# Patient Record
Sex: Female | Born: 1940 | Race: White | Hispanic: No | Smoking: Former smoker
Health system: Southern US, Community
[De-identification: ages and names within clinical notes are randomized; demographics above are authoritative.]

## PROBLEM LIST (undated history)

## (undated) DIAGNOSIS — M1990 Unspecified osteoarthritis, unspecified site: Secondary | ICD-10-CM

## (undated) DIAGNOSIS — I10 Essential (primary) hypertension: Secondary | ICD-10-CM

## (undated) DIAGNOSIS — K219 Gastro-esophageal reflux disease without esophagitis: Secondary | ICD-10-CM

## (undated) DIAGNOSIS — F419 Anxiety disorder, unspecified: Secondary | ICD-10-CM

## (undated) DIAGNOSIS — J302 Other seasonal allergic rhinitis: Secondary | ICD-10-CM

## (undated) HISTORY — PX: PARTIAL HYSTERECTOMY: SHX80

---

## 2010-08-21 ENCOUNTER — Other Ambulatory Visit: Payer: Self-pay

## 2011-06-13 ENCOUNTER — Ambulatory Visit: Payer: Self-pay

## 2011-06-13 LAB — LACTATE DEHYDROGENASE: LDH: 212 U/L (ref 84–246)

## 2011-12-20 ENCOUNTER — Ambulatory Visit: Payer: Self-pay

## 2011-12-20 LAB — LACTATE DEHYDROGENASE: LDH: 177 U/L (ref 81–234)

## 2015-05-11 ENCOUNTER — Ambulatory Visit: Payer: Self-pay | Admitting: Family Medicine

## 2015-06-01 ENCOUNTER — Ambulatory Visit: Payer: Self-pay | Admitting: Family Medicine

## 2015-06-09 ENCOUNTER — Ambulatory Visit: Payer: Self-pay | Admitting: Family Medicine

## 2015-11-05 ENCOUNTER — Ambulatory Visit
Admission: EM | Admit: 2015-11-05 | Discharge: 2015-11-05 | Disposition: A | Discharge status: Home / Self Care | Payer: Medicare Other | Attending: Family Medicine | Admitting: Family Medicine

## 2015-11-05 DIAGNOSIS — L03119 Cellulitis of unspecified part of limb: Secondary | ICD-10-CM

## 2015-11-05 DIAGNOSIS — L02419 Cutaneous abscess of limb, unspecified: Secondary | ICD-10-CM

## 2015-11-05 HISTORY — DX: Other seasonal allergic rhinitis: J30.2

## 2015-11-05 HISTORY — DX: Essential (primary) hypertension: I10

## 2015-11-05 HISTORY — DX: Unspecified osteoarthritis, unspecified site: M19.90

## 2015-11-05 HISTORY — DX: Anxiety disorder, unspecified: F41.9

## 2015-11-05 HISTORY — DX: Gastro-esophageal reflux disease without esophagitis: K21.9

## 2015-11-05 MED ORDER — DOXYCYCLINE HYCLATE 100 MG PO CAPS: 100.0000 mg | ORAL_CAPSULE | Freq: Two times a day (BID) | ORAL | Status: DC

## 2015-11-05 MED ORDER — CEFTRIAXONE SODIUM 1 G IJ SOLR
1.0000 g | Freq: Once | INTRAMUSCULAR | Status: AC
  Administered 2015-11-05: 1 g via INTRAMUSCULAR

## 2015-11-05 MED ORDER — MUPIROCIN 2 % EX OINT: 1.0000 "application " | TOPICAL_OINTMENT | Freq: Three times a day (TID) | CUTANEOUS | Status: DC

## 2015-11-05 MED ORDER — ONDANSETRON 8 MG PO TBDP: 8.0000 mg | ORAL_TABLET | Freq: Three times a day (TID) | ORAL | Status: DC | PRN

## 2015-11-05 NOTE — ED Provider Notes (Signed)
CSN: [id number]     Arrival date & time 11/05/15  1122 History   First MD Initiated Contact with Patient 11/05/15 1302    Nurses notes were reviewed. Chief Complaint  Patient presents with  . Skin Discoloration    Pt with right lower leg redness, warmth and pain starting with itching at first on Thursday. Scratched it and then by Saturday it had turned red and painful. Pain 7/10   Patient is here because of skin discoloration of the right lower leg. She reports scratching her leg Thursday and by Saturday it was red and painful. She is convinced is a possible blood clot explained to her that blood clots usage not occur from scratching her leg a few days earlier and then having this much redness and swelling. Initiation at 1 m touch her leg explained to her that A touch her leg to evaluate her. Turns out that she has a skin condition on both legs and some on her arm that she sees his dermatologist in [place] but she does not know the name of the condition.  This is some type of chronic condition that causes scaling and peeling of the skin past normal for her. For whatever reason it was more itching of the leg on Thursday and that's when she starts scratching. 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  Allergic to multiple antibiotics. There is a long list of antibiotics that she is allergic to including vancomycin Septra and minocycline causes GI upset she's looked to penicillin Levaquin and Avelox and clindamycin.  Past history is positive for arthritis hypertension anxiety GERD. She former smoker and she's had a partial hysterectomy family medical history is essentially noncontributory to this visit.  (Consider location/radiation/quality/duration/timing/severity/associated sxs/prior Treatment) Patient is a 75 y.o. female presenting with rash. The history is provided by the patient  and a relative. No language interpreter was used.  Rash Location:  Foot and leg Leg rash location:  R lower leg Quality: itchiness, redness and swelling   Onset quality:  Sudden Progression:  Worsening Chronicity:  New Relieved by:  None tried Ineffective treatments:  Antibiotic cream Associated symptoms: induration     Past Medical History  Diagnosis Date  . Arthritis   . Hypertension   . Anxiety   . Seasonal allergic rhinitis   . GERD (gastroesophageal reflux disease)    Past Surgical History  Procedure Laterality Date  . Partial hysterectomy     History reviewed. No pertinent family history. Social History  Substance Use Topics  . Smoking status: Former Games developer  . Smokeless tobacco: None  . Alcohol Use: Yes     Comment: occaional   OB History    No data available     Review of Systems  Skin: Positive for rash.  All other systems reviewed and are negative.   Allergies  Avelox; Clindamycin/lincomycin; Levaquin; Minocycline; Penicillins; Sulfa antibiotics; and Vancomycin  Home Medications   Prior to Admission medications   Medication Sig Start Date End Date Taking? Authorizing Provider  amLODipine (NORVASC) 10 MG tablet Take 10 mg by mouth daily.   Yes Historical Provider, MD  esomeprazole (NEXIUM) 20 MG capsule Take 20 mg by mouth daily at 12 noon.   Yes Historical Provider, MD  fentaNYL (DURAGESIC - DOSED MCG/HR) 100 MCG/HR Place 150 mcg onto the skin every 3 (three) days.   Yes  Historical Provider, MD  fexofenadine-pseudoephedrine (ALLEGRA-D 24) 180-240 MG 24 hr tablet Take 1 tablet by mouth daily.   Yes Historical Provider, MD  fluticasone (FLONASE) 50 MCG/ACT nasal spray Place into both nostrils daily.   Yes Historical Provider, MD  hydrochlorothiazide (HYDRODIURIL) 25 MG tablet Take 25 mg by mouth daily.   Yes Historical Provider, MD  lidocaine (LIDODERM) 5 % Place 1 patch onto the skin daily. Remove & Discard patch within 12 hours or as directed by MD    Yes Historical Provider, MD  losartan (COZAAR) 50 MG tablet Take 50 mg by mouth daily.   Yes Historical Provider, MD  oxyCODONE-acetaminophen (PERCOCET) 10-325 MG tablet Take 1 tablet by mouth every 4 (four) hours as needed for pain.   Yes Historical Provider, MD  psyllium (METAMUCIL) 58.6 % packet Take 1 packet by mouth daily.   Yes Historical Provider, MD  sertraline (ZOLOFT) 50 MG tablet Take 50 mg by mouth daily.   Yes Historical Provider, MD  doxycycline (VIBRAMYCIN) 100 MG capsule Take 1 capsule (100 mg total) by mouth 2 (two) times daily. 11/05/15   Hassan Rowan, MD  mupirocin ointment (BACTROBAN) 2 % Apply 1 application topically 3 (three) times daily. 11/05/15   Hassan Rowan, MD  ondansetron (ZOFRAN ODT) 8 MG disintegrating tablet Take 1 tablet (8 mg total) by mouth every 8 (eight) hours as needed for nausea or vomiting. 11/05/15   Hassan Rowan, MD   Meds Ordered and Administered this Visit   Medications  cefTRIAXone (ROCEPHIN) injection 1 g (1 g Intramuscular Given 11/05/15 1334)         BP 140/88 mmHg  Pulse 79  Temp(Src) 98 F (36.7 C) (Oral)  Resp 20  Ht 4\' 10"  (1.473 m)  Wt 130 lb (58.968 kg)  BMI 27.18 kg/m2  SpO2 100% No data found.   Physical Exam  Constitutional: She is oriented to person, place, and time. She appears well-developed.  HENT:  Head: Normocephalic and atraumatic.  Eyes: Pupils are equal, round, and reactive to light.  Neurological: She is alert and oriented to person, place, and time.  Skin: Lesion and rash noted. There is erythema.     On the back of the right leg including the calf is marked redness erythema and warmth and apparently very tender to palpation. Homans sign is positive but is also a eschar on the back of the right calf and on the dorsum of the right foot the eschar on the back of the right calf as well as she's been scratching. Is also scar tissue on the lateral aspect of the right lower leg which she had a skin cancer removed. On all 4 of  his extremities she's got a healing type of dermatitis. The legs is extremely much worse in the upper arms and looks like almost some type of plaque like disease forming on the surface of both lower extremities.  Psychiatric: Her behavior is normal.  Vitals reviewed.   ED Course  Procedures (including critical care time)  Labs Review Labs Reviewed - No data to display  Imaging Review No results found.   Visual Acuity Review  Right Eye Distance:   Left Eye Distance:   Bilateral Distance:    Right Eye Near:   Left Eye Near:    Bilateral Near:         MDM   1. Cellulitis and abscess of leg      Explained to her that and a relative that she has cellulitis of  the right lower leg. Apparently scratching Thursday evening she felt that that was in the oculus broke the skin enough to allow staff prescription or strep infection to occur. She also has a dermatological lesion of both lower extremities and upper extent his most worse in the lower extremities that she does not know the name of. We also seems to be a good source for skin breakdown and skin barrier to be poor. Will Mr. shot of Rocephin 1 g here. Explained to her that I want you with the staff agent like Septra but I cannot because she has had anaphylactic response before. Since the medicine of systemic we'll try doxycycline with food and Zofran for nausea. Will also place her on Bactroban ointment. Strongly suggest she follow-up with her PCP on Tuesday if not better for reevaluation and re-reassessment. Also explain to them that she may wind up in the emergency room may require IV antibiotics even though she can't take IV vancomycin  Should be noted that he wanted patient is sitting here she is actively rubbing the right leg     Hassan Rowan, MD 11/05/15 1342

## 2015-11-05 NOTE — Discharge Instructions (Signed)
Cellulitis Cellulitis is an infection of the skin and the tissue under the skin. The infected area is usually red and tender. This happens most often in the arms and lower legs. HOME CARE   Take your antibiotic medicine as told. Finish the medicine even if you start to feel better.  Keep the infected arm or leg raised (elevated).  Put a warm cloth on the area up to 4 times per day.  Only take medicines as told by your doctor.  Keep all doctor visits as told. GET HELP IF:  You see red streaks on the skin coming from the infected area.  Your red area gets bigger or turns a dark color.  Your bone or joint under the infected area is painful after the skin heals.  Your infection comes back in the same area or different area.  You have a puffy (swollen) bump in the infected area.  You have new symptoms.  You have a fever. GET HELP RIGHT AWAY IF:   You feel very sleepy.  You throw up (vomit) or have watery poop (diarrhea).  You feel sick and have muscle aches and pains.   This information is not intended to replace advice given to you by your health care provider. Make sure you discuss any questions you have with your health care provider.   Document Released: 11/06/2007 Document Revised: 02/08/2015 Document Reviewed: 08/05/2011 Elsevier Interactive Patient Education 2016 [employer].  MRSA FAQs What is MRSA? Staphylococcus aureus (pronounced staff-ill-oh-KOK-us AW-ree-us), or "Staph" is a very common germ that about 1 out of every 3 people have on their skin or in their nose. This germ does not cause any problems for most people who have it on their skin. But sometimes it can cause serious infections such as skin or wound infections, pneumonia, or infections of the blood.  Antibiotics are given to kill Staph germs when they cause infections. Some Staph are resistant, meaning they cannot be killed by some antibiotics. "Methicillin-resistant Staphylococcus aureus" or "MRSA" is a  type of Staph that is resistant to some of the antibiotics that are often used to treat Staph infections. Who is most likely to get an MRSA infection? In the hospital, people who are more likely to get an MRSA infection are people who:  have other health conditions making them sick  have been in the hospital or a nursing home  have been treated with antibiotics. People who are healthy and who have not been in the hospital or a nursing home can also get MRSA infections. These infections usually involve the skin. More information about this type of MRSA infection, known as "community-associated MRSA" infection, is available from the Centers for Disease Control and Prevention (CDC). ReportNation.uy How do I get an MRSA infection? People who have MRSA germs on their skin or who are infected with MRSA may be able to spread the germ to other people. MRSA can be passed on to bed linens, bed rails, bathroom fixtures, and medical equipment. It can spread to other people on contaminated equipment and on the hands of doctors, nurses, other healthcare providers and visitors. Can MRSA infections be treated? Yes, there are antibiotics that can kill MRSA germs. Some patients with MRSA abscesses may need surgery to drain the infection. Your healthcare provider will determine which treatments are best for you. What are some of the things that hospitals are doing to prevent MRSA infections? To prevent MRSA infections, doctors, nurses and other healthcare providers:  Clean their hands with  soap and water or an alcohol-based hand rub before and after caring for every patient.  Carefully clean hospital rooms and medical equipment.  Use Contact Precautions when caring for patients with MRSA. Contact Precautions mean:  Whenever possible, patients with MRSA will have a single room or will share a room only with someone else who also has MRSA.  Healthcare providers will put on gloves and wear a gown over  their clothing while taking care of patients with MRSA.  Visitors may also be asked to wear a gown and gloves.  When leaving the room, hospital providers and visitors remove their gown and gloves and clean their hands.  Patients on Contact Precautions are asked to stay in their hospital rooms as much as possible. They should not go to common areas, such as the gift shop or cafeteria. They may go to other areas of the hospital for treatments and tests.  May test some patients to see if they have MRSA on their skin. This test involves rubbing a cotton-tipped swab in the patient's nostrils or on the skin. What can I do to help prevent MRSA infections? In the hospital  Make sure that all doctors, nurses, and other healthcare providers clean their hands with soap and water or an alcohol-based hand rub before and after caring for you. If you do not see your providers clean their hands, please ask them to do so. When you go home  If you have wounds or an intravascular device (such as a catheter or dialysis port) make sure that you know how to take care of them. Can my friends and family get MRSA when they visit me? The chance of getting MRSA while visiting a person who has MRSA is very low. To decrease the chance of getting MRSA your family and friends should:  Clean their hands before they enter your room and when they leave.  Ask a healthcare provider if they need to wear protective gowns and gloves when they visit you. What do I need to do when I go home from the hospital? To prevent another MRSA infection and to prevent spreading MRSA to others:  Keep taking any antibiotics prescribed by your doctor. Don't take half-doses or stop before you complete your prescribed course.  Clean your hands often, especially before and after changing your wound dressing or bandage.  People who live with you should clean their hands often as well.  Keep any wounds clean and change bandages as instructed  until healed.  Avoid sharing personal items such as towels or razors.  Wash and dry your clothes and bed linens in the warmest temperatures recommended on the labels.  Tell your healthcare providers that you have MRSA. This includes home health nurses and aides, therapists, and personnel in doctors' offices.  Your doctor may have more instructions for you. If you have questions, please ask your doctor or nurse. Developed and co-sponsored by [employer] for [employer] of [place] [phone]); Infectious Diseases Society of America (IDSA); [facility]; Association for Professionals in Infection Control and Epidemiology (APIC); Centers for Disease Control and Prevention (CDC); and [employer]   This information is not intended to replace advice given to you by your health care provider. Make sure you discuss any questions you have with your health care provider.   Document Released: 05/25/2013 Document Reviewed: 08/03/2014 Elsevier Interactive Patient Education [employer]

## 2015-12-25 ENCOUNTER — Encounter
Admission: RE | Admit: 2015-12-25 | Discharge: 2015-12-25 | Disposition: A | Discharge status: Home / Self Care | Payer: Medicare Other | Source: Ambulatory Visit | Attending: Internal Medicine | Admitting: Internal Medicine

## 2015-12-27 LAB — COMPREHENSIVE METABOLIC PANEL
ALK PHOS: 58 U/L (ref 38–126)
ALT: 38 U/L (ref 14–54)
AST: 50 U/L — ABNORMAL HIGH (ref 15–41)
Albumin: 3.1 g/dL — ABNORMAL LOW (ref 3.5–5.0)
Anion gap: 5 (ref 5–15)
BILIRUBIN TOTAL: 0.9 mg/dL (ref 0.3–1.2)
BUN: 24 mg/dL — AB (ref 6–20)
CHLORIDE: 103 mmol/L (ref 101–111)
CO2: 30 mmol/L (ref 22–32)
CREATININE: 1.05 mg/dL — AB (ref 0.44–1.00)
Calcium: 8.7 mg/dL — ABNORMAL LOW (ref 8.9–10.3)
GFR calc Af Amer: 60 mL/min — ABNORMAL LOW (ref >60)
GFR, EST NON AFRICAN AMERICAN: 51 mL/min — AB (ref >60)
GLUCOSE: 112 mg/dL — AB (ref 65–99)
Potassium: 4 mmol/L (ref 3.5–5.1)
Sodium: 138 mmol/L (ref 135–145)
Total Protein: 6.8 g/dL (ref 6.5–8.1)

## 2015-12-27 LAB — CBC WITH DIFFERENTIAL/PLATELET
Basophils Absolute: 0.1 10*3/uL (ref 0–0.1)
Basophils Relative: 1 %
EOS PCT: 5 %
Eosinophils Absolute: 0.5 10*3/uL (ref 0–0.7)
HEMATOCRIT: 27 % — AB (ref 35.0–47.0)
Hemoglobin: 9.4 g/dL — ABNORMAL LOW (ref 12.0–16.0)
LYMPHS ABS: 1.7 10*3/uL (ref 1.0–3.6)
LYMPHS PCT: 20 %
MCH: 32.2 pg (ref 26.0–34.0)
MCHC: 34.8 g/dL (ref 32.0–36.0)
MCV: 92.3 fL (ref 80.0–100.0)
Monocytes Absolute: 0.6 10*3/uL (ref 0.2–0.9)
Monocytes Relative: 7 %
Neutro Abs: 5.7 10*3/uL (ref 1.4–6.5)
Neutrophils Relative %: 67 %
PLATELETS: 457 10*3/uL — AB (ref 150–440)
RBC: 2.93 MIL/uL — AB (ref 3.80–5.20)
RDW: 15.9 % — ABNORMAL HIGH (ref 11.5–14.5)
WBC: 8.5 10*3/uL (ref 3.6–11.0)

## 2016-01-02 ENCOUNTER — Encounter
Admission: RE | Admit: 2016-01-02 | Discharge: 2016-01-02 | Disposition: A | Discharge status: Home / Self Care | Payer: Medicare Other | Source: Ambulatory Visit | Attending: Internal Medicine | Admitting: Internal Medicine

## 2016-01-18 ENCOUNTER — Non-Acute Institutional Stay (SKILLED_NURSING_FACILITY): Payer: Medicare Other | Admitting: Gerontology

## 2016-01-18 DIAGNOSIS — S7291XD Unspecified fracture of right femur, subsequent encounter for closed fracture with routine healing: Secondary | ICD-10-CM

## 2016-01-18 DIAGNOSIS — G8929 Other chronic pain: Secondary | ICD-10-CM | POA: Diagnosis not present

## 2016-01-20 NOTE — Progress Notes (Signed)
Location:      Place of Service:  SNF (31) Provider:  Toni Arthurs, NP-C  No primary care provider on file.  No care team member to display  Extended Emergency Contact Information Primary Emergency Contact: Carter,Todd  [place] Mobile Phone: [phone] Relation: Son  Code Status:  full Goals of care: Advanced Directive information Advanced Directives 11/05/2015  Does patient have an advance directive? Yes  Type of Advance Directive Living will;Healthcare Power of Attorney     Chief Complaint  Patient presents with  . Follow-up    HPI:  Pt is a 75 y.o. female seen today for medical management of chronic diseases. She does have chronic pain r/t rheumatoid arthritis and crohn's disease. She is already on Fentanyl 150 mcg patch- changed EOD for pain control which complicated pain control for the femur fracture. She is participating in physical therapy and occupational therapy and is progressing well. VSS. No other complaints.      Past Medical History:  Diagnosis Date  . Anxiety   . Arthritis   . GERD (gastroesophageal reflux disease)   . Hypertension   . Seasonal allergic rhinitis    Past Surgical History:  Procedure Laterality Date  . PARTIAL HYSTERECTOMY      Allergies  Allergen Reactions  . Avelox [Moxifloxacin Hcl In Nacl] Swelling  . Calamine     RASH  . Clindamycin Diarrhea and Nausea Only  . Clindamycin/Lincomycin Nausea And Vomiting  . Doxepin     Other reaction(s): Diarrhea  . Fluorouracil     Other reaction(s): Rash  . Levaquin [Levofloxacin] Diarrhea  . Minocycline Diarrhea and Nausea Only  . Penicillins Swelling  . Sulfa Antibiotics Hives  . Vancomycin Swelling  . Latex Rash  . Neomycin-Bacitracin Zn-Polymyx Rash      Medication List       Accurate as of 01/18/16 11:59 PM. Always use your most recent med list.          amLODipine 10 MG tablet Commonly known as:  NORVASC Take 10 mg by mouth daily.   doxycycline  100 MG capsule Commonly known as:  VIBRAMYCIN Take 1 capsule (100 mg total) by mouth 2 (two) times daily.   esomeprazole 20 MG capsule Commonly known as:  NEXIUM Take 20 mg by mouth daily at 12 noon.   Esomeprazole Magnesium 20 MG Tbec Take by mouth.   fentaNYL 100 MCG/HR Commonly known as:  DURAGESIC - dosed mcg/hr Place 150 mcg onto the skin every 3 (three) days.   fentaNYL 50 MCG/HR Commonly known as:  DURAGESIC - dosed mcg/hr Place onto the skin.   fexofenadine-pseudoephedrine 180-240 MG 24 hr tablet Commonly known as:  ALLEGRA-D 24 Take 1 tablet by mouth daily.   fluticasone 50 MCG/ACT nasal spray Commonly known as:  FLONASE Place into both nostrils daily.   hydrochlorothiazide 25 MG tablet Commonly known as:  HYDRODIURIL Take 25 mg by mouth daily.   IRON PO Take by mouth.   lidocaine 5 % Commonly known as:  LIDODERM Place 1 patch onto the skin daily. Remove & Discard patch within 12 hours or as directed by MD   losartan 50 MG tablet Commonly known as:  COZAAR Take 50 mg by mouth daily.   mupirocin ointment 2 % Commonly known as:  BACTROBAN Apply 1 application topically 3 (three) times daily.   ondansetron 8 MG disintegrating tablet Commonly known as:  ZOFRAN ODT Take 1 tablet (8 mg total) by mouth every 8 (eight) hours as  needed for nausea or vomiting.   oxyCODONE-acetaminophen 10-325 MG tablet Commonly known as:  PERCOCET Take 1 tablet by mouth every 4 (four) hours as needed for pain.   psyllium 58.6 % packet Commonly known as:  METAMUCIL Take 1 packet by mouth daily.   sertraline 50 MG tablet Commonly known as:  ZOLOFT Take 50 mg by mouth daily.       Review of Systems  Constitutional: Negative for activity change, appetite change, chills, diaphoresis and fever.  HENT: Negative for congestion, sneezing, sore throat, trouble swallowing and voice change.   Respiratory: Negative for apnea, cough, choking, chest tightness, shortness of breath  and wheezing.   Cardiovascular: Negative for chest pain, palpitations and leg swelling.  Gastrointestinal: Negative for abdominal distention, abdominal pain, constipation, diarrhea and nausea.  Genitourinary: Negative for difficulty urinating, dysuria, frequency and urgency.  Musculoskeletal: Positive for arthralgias (typical arthritis) and gait problem. Negative for back pain and myalgias.  Skin: Positive for wound (right hip incisions). Negative for color change, pallor and rash.  Neurological: Negative for dizziness, tremors, syncope, speech difficulty, weakness, numbness and headaches.  Psychiatric/Behavioral: Negative for agitation and behavioral problems.  All other systems reviewed and are negative.    There is no immunization history on file for this patient. There are no preventive care reminders to display for this patient. No flowsheet data found. Functional Status Survey:    Vitals:   01/18/16 0500  BP: (!) 142/53  Pulse: 67  Resp: 16  Temp: 98.1 F (36.7 C)  SpO2: 97%  Weight: 128 lb 1.6 oz (58.1 kg)   Body mass index is 26.77 kg/m. Physical Exam  Constitutional: She is oriented to person, place, and time. Vital signs are normal. She appears well-developed and well-nourished. She is active and cooperative. She does not appear ill. No distress.  HENT:  Head: Normocephalic and atraumatic.  Mouth/Throat: Uvula is midline, oropharynx is clear and moist and mucous membranes are normal. Mucous membranes are not pale, not dry and not cyanotic.  Eyes: Conjunctivae, EOM and lids are normal. Pupils are equal, round, and reactive to light.  Neck: Trachea normal, normal range of motion and full passive range of motion without pain. Neck supple. No JVD present. No tracheal deviation, no edema and no erythema present. No thyromegaly present.  Cardiovascular: Normal rate, regular rhythm, normal heart sounds, intact distal pulses and normal pulses.  Exam reveals no gallop and no  distant heart sounds.   Pulmonary/Chest: Effort normal and breath sounds normal. No accessory muscle usage. No respiratory distress. She has no wheezes. She has no rales. She exhibits no tenderness.  Abdominal: Soft. Normal appearance and bowel sounds are normal. She exhibits no distension and no ascites. There is no tenderness.  Musculoskeletal: She exhibits no edema.       Right hip: She exhibits decreased range of motion, decreased strength and tenderness.  Expected osteoarthritis, stiffness; chronic pain; calves, soft, supple. Negative homan's sign  Neurological: She is alert and oriented to person, place, and time. She has normal strength.  Skin: Skin is warm and dry. Laceration (Right hip incision) noted. No rash noted. She is not diaphoretic. No cyanosis or erythema. No pallor. Nails show no clubbing.  Psychiatric: She has a normal mood and affect. Her speech is normal and behavior is normal. Judgment and thought content normal. Cognition and memory are normal.  Nursing note and vitals reviewed.   Labs reviewed:  Recent Labs  12/27/15 1020  NA 138  K 4.0  CL 103  CO2 30  GLUCOSE 112*  BUN 24*  CREATININE 1.05*  CALCIUM 8.7*    Recent Labs  12/27/15 1020  AST 50*  ALT 38  ALKPHOS 58  BILITOT 0.9  PROT 6.8  ALBUMIN 3.1*    Recent Labs  12/27/15 1020  WBC 8.5  NEUTROABS 5.7  HGB 9.4*  HCT 27.0*  MCV 92.3  PLT 457*   No results found for: TSH No results found for: HGBA1C No results found for: CHOL, HDL, LDLCALC, LDLDIRECT, TRIG, CHOLHDL  Significant Diagnostic Results in last 30 days:  No results found.  Assessment/Plan 1. Closed fracture of right femur with routine healing, unspecified fracture morphology, unspecified portion of femur, subsequent encounter Pain control as listed below Continue current pt/ot schedule Continue scheduled APAP 650 mg po QID for pain Ice pack to right hip prn Continue lovenox for dvt prophylaxis   2. Chronic  pain Increase fentanyl patch dosing to a 100 mcg patch, 75 mcg patch and a 12 mcg patch Re-evaluate amount of prn oxycodone taken over the weekend and possibly increase patch dosing to 200 mcg on Monday or Tuesday    Family/ staff Communication:   Total Time: 30 minutes  Documentation: 15 minutes  Face to Face: 15 minutes  Family/Phone:   Labs/tests ordered: cbc. Met c  Medication list reviewed and assessed for continued appropriateness. Monthly medication orders reviewed and signed.  Vikki Ports, NP-C Geriatrics [facility] [phone], [place] [address] Cell Phone (Mon-Fri 8am-5pm):  [phone] On Call:  [phone] & follow prompts after 5pm & weekends Office Phone:  [phone] Office Fax:  [phone]

## 2016-10-07 ENCOUNTER — Ambulatory Visit: Payer: Medicare Other | Admitting: Pain Medicine

## 2017-06-05 ENCOUNTER — Emergency Department
Admission: EM | Admit: 2017-06-05 | Discharge: 2017-06-06 | Disposition: A | Discharge status: Home / Self Care | Payer: Medicare Other | Attending: Emergency Medicine | Admitting: Emergency Medicine

## 2017-06-05 ENCOUNTER — Emergency Department: Payer: Medicare Other

## 2017-06-05 DIAGNOSIS — I10 Essential (primary) hypertension: Secondary | ICD-10-CM | POA: Insufficient documentation

## 2017-06-05 DIAGNOSIS — S59911A Unspecified injury of right forearm, initial encounter: Secondary | ICD-10-CM | POA: Diagnosis present

## 2017-06-05 DIAGNOSIS — Y9301 Activity, walking, marching and hiking: Secondary | ICD-10-CM | POA: Insufficient documentation

## 2017-06-05 DIAGNOSIS — Y999 Unspecified external cause status: Secondary | ICD-10-CM | POA: Diagnosis not present

## 2017-06-05 DIAGNOSIS — Y92129 Unspecified place in nursing home as the place of occurrence of the external cause: Secondary | ICD-10-CM | POA: Diagnosis not present

## 2017-06-05 DIAGNOSIS — W010XXA Fall on same level from slipping, tripping and stumbling without subsequent striking against object, initial encounter: Secondary | ICD-10-CM | POA: Diagnosis not present

## 2017-06-05 DIAGNOSIS — S52134A Nondisplaced fracture of neck of right radius, initial encounter for closed fracture: Secondary | ICD-10-CM | POA: Insufficient documentation

## 2017-06-05 DIAGNOSIS — Z91040 Latex allergy status: Secondary | ICD-10-CM | POA: Insufficient documentation

## 2017-06-05 DIAGNOSIS — Z79899 Other long term (current) drug therapy: Secondary | ICD-10-CM | POA: Diagnosis not present

## 2017-06-05 DIAGNOSIS — Z87891 Personal history of nicotine dependence: Secondary | ICD-10-CM | POA: Diagnosis not present

## 2017-06-05 DIAGNOSIS — W19XXXA Unspecified fall, initial encounter: Secondary | ICD-10-CM

## 2017-06-05 MED ORDER — ACETAMINOPHEN 500 MG PO TABS
1000.0000 mg | ORAL_TABLET | Freq: Once | ORAL | Status: AC
  Administered 2017-06-05: 1000 mg via ORAL
  Filled 2017-06-05: qty 2

## 2017-06-05 NOTE — ED Provider Notes (Signed)
[facility] Emergency Department Provider Note   ____________________________________________   I have reviewed the triage vital signs and the nursing notes.   HISTORY  Chief Complaint Fall   History limited by: Not Limited   HPI Andrea Barker is a 77 y.o. female who presents to the emergency department today because of right elbow pain.   LOCATION:right elbow DURATION:roughly 9 hours TIMING: started a little after a fall, gradually worsening SEVERITY: severe CONTEXT: patient states that she fell earlier today when she tripped over her walker. She denies any feeling of lightheadedness. Fell onto her right hand. Did not have any immediate pain however the pain developed a little while later. Since then it has been constant and worsening. She has not tried any pain medication. MODIFYING FACTORS: worse with movement ASSOCIATED SYMPTOMS: denies any other acute injury  Per medical record review patient has a history of arthritis  Past Medical History:  Diagnosis Date  . Anxiety   . Arthritis   . GERD (gastroesophageal reflux disease)   . Hypertension   . Seasonal allergic rhinitis     There are no active problems to display for this patient.   Past Surgical History:  Procedure Laterality Date  . PARTIAL HYSTERECTOMY      Prior to Admission medications   Medication Sig Start Date End Date Taking? Authorizing Provider  amLODipine (NORVASC) 10 MG tablet Take 10 mg by mouth daily.    [provider]  doxycycline (VIBRAMYCIN) 100 MG capsule Take 1 capsule (100 mg total) by mouth 2 (two) times daily. 11/05/15   Hassan Rowan, MD  esomeprazole (NEXIUM) 20 MG capsule Take 20 mg by mouth daily at 12 noon.    [provider]  Esomeprazole Magnesium 20 MG TBEC Take by mouth.    [provider]  fentaNYL (DURAGESIC - DOSED MCG/HR) 100 MCG/HR Place 150 mcg onto the skin every 3 (three) days.    [provider]  fentaNYL  (DURAGESIC - DOSED MCG/HR) 50 MCG/HR Place onto the skin.    [provider]  fexofenadine-pseudoephedrine (ALLEGRA-D 24) 180-240 MG 24 hr tablet Take 1 tablet by mouth daily.    [provider]  fluticasone (FLONASE) 50 MCG/ACT nasal spray Place into both nostrils daily.    [provider]  hydrochlorothiazide (HYDRODIURIL) 25 MG tablet Take 25 mg by mouth daily.    [provider]  IRON PO Take by mouth.    [provider]  lidocaine (LIDODERM) 5 % Place 1 patch onto the skin daily. Remove & Discard patch within 12 hours or as directed by MD    [provider]  losartan (COZAAR) 50 MG tablet Take 50 mg by mouth daily.    [provider]  mupirocin ointment (BACTROBAN) 2 % Apply 1 application topically 3 (three) times daily. 11/05/15   Hassan Rowan, MD  ondansetron (ZOFRAN ODT) 8 MG disintegrating tablet Take 1 tablet (8 mg total) by mouth every 8 (eight) hours as needed for nausea or vomiting. 11/05/15   Hassan Rowan, MD  oxyCODONE-acetaminophen (PERCOCET) 10-325 MG tablet Take 1 tablet by mouth every 4 (four) hours as needed for pain.    [provider]  psyllium (METAMUCIL) 58.6 % packet Take 1 packet by mouth daily.    [provider]  sertraline (ZOLOFT) 50 MG tablet Take 50 mg by mouth daily.    [provider]    Allergies Avelox [moxifloxacin hcl in nacl]; Calamine; Clindamycin; Clindamycin/lincomycin; Doxepin; Fluorouracil; Levaquin [levofloxacin];  Minocycline; Penicillins; Sulfa antibiotics; Vancomycin; Latex; and Neomycin-bacitracin zn-polymyx  No family history on file.  Social History Social History   Tobacco Use  . Smoking status: Former Smoker  Substance Use Topics  . Alcohol use: Yes    Comment: occaional  . Drug use: Not on file    Review of Systems Constitutional: No fever/chills Eyes: No visual changes. ENT: No sore throat. Cardiovascular: Denies chest pain. Respiratory: Denies  shortness of breath. Gastrointestinal: No abdominal pain.  No nausea, no vomiting.  No diarrhea.   Genitourinary: Negative for dysuria. Musculoskeletal: Positive for right elbow pain. Positive for chronic back pain. Skin: Negative for rash. Neurological: Negative for headaches, focal weakness or numbness.  ____________________________________________   PHYSICAL EXAM:  VITAL SIGNS: ED Triage Vitals [06/05/17 2313]  Enc Vitals Group     BP      Pulse      Resp      Temp      Temp src      SpO2 98 %     Weight      Height      Head Circumference      Peak Flow      Pain Score      Pain Loc      Pain Edu?      Excl. in GC?      Constitutional: Alert and oriented. Well appearing and in no distress. Eyes: Conjunctivae are normal.  ENT   Head: Normocephalic and atraumatic.   Nose: No congestion/rhinnorhea.   Mouth/Throat: Mucous membranes are moist.   Neck: No stridor. Hematological/Lymphatic/Immunilogical: No cervical lymphadenopathy. Cardiovascular: Normal rate, regular rhythm.  No murmurs, rubs, or gallops.  Respiratory: Normal respiratory effort without tachypnea nor retractions. Breath sounds are clear and equal bilaterally. No wheezes/rales/rhonchi. Gastrointestinal: Soft and non tender. No rebound. No guarding.  Genitourinary: Deferred Musculoskeletal: Right upper extremity without deformity. Mild tenderness to the elbow with palpation and manipulation. RP 2+. Grip strength 5/5.  Neurologic:  Normal speech and language. No gross focal neurologic deficits are appreciated.  Skin:  Skin is warm, dry and intact. No rash noted. Psychiatric: Mood and affect are normal. Speech and behavior are normal. Patient exhibits appropriate insight and judgment.  ____________________________________________    LABS (pertinent positives/negatives)  None  ____________________________________________   EKG  None  ____________________________________________     RADIOLOGY  Right elbow Effusion with probably impacted fracture of radial neck  ____________________________________________   PROCEDURES  Procedures  ____________________________________________   INITIAL IMPRESSION / ASSESSMENT AND PLAN / ED COURSE  Pertinent labs & imaging results that were available during my care of the patient were reviewed by me and considered in my medical decision making (see chart for details).  Patient presents with right elbow pain after a fall. X-ray is concerning for impacted fracture of radial neck. Will place patient in sling. Discussed importance of early range of motion of joint. Discussed orthopedic follow up.   ____________________________________________   FINAL CLINICAL IMPRESSION(S) / ED DIAGNOSES  Final diagnoses:  Fall, initial encounter  Closed nondisplaced fracture of neck of right radius, initial encounter     Note: This dictation was prepared with Dragon dictation. Any transcriptional errors that result from this process are unintentional     Phineas Semen, MD 06/06/17 0130

## 2017-06-05 NOTE — ED Triage Notes (Signed)
Pt arrived via EMS from [facility] with reports of a fall around 2 pm today. EMS report pt was checked out at facility but has had increasing pain in her right arm since the fall, unable to move rt arm. No distress noted at this time.

## 2017-06-06 NOTE — ED Notes (Signed)
RN called report on pt to [facility]. Report given to Casimiro Needle

## 2017-06-06 NOTE — Discharge Instructions (Signed)
Please seek medical attention for any high fevers, chest pain, shortness of breath, change in behavior, persistent vomiting, bloody stool or any other new or concerning symptoms.  

## 2017-06-06 NOTE — ED Notes (Signed)
E signature pad currently not working. Pt verbalized understanding of discharge, and follow up  Instructions, discharge paperwork place in packet to be sent with pt via ems

## 2017-06-16 ENCOUNTER — Encounter: Payer: Self-pay | Admitting: *Deleted

## 2017-06-16 ENCOUNTER — Other Ambulatory Visit: Payer: Self-pay

## 2017-06-16 DIAGNOSIS — L03116 Cellulitis of left lower limb: Secondary | ICD-10-CM | POA: Diagnosis not present

## 2017-06-16 DIAGNOSIS — Z87891 Personal history of nicotine dependence: Secondary | ICD-10-CM | POA: Insufficient documentation

## 2017-06-16 DIAGNOSIS — S81802D Unspecified open wound, left lower leg, subsequent encounter: Secondary | ICD-10-CM | POA: Diagnosis present

## 2017-06-16 DIAGNOSIS — I10 Essential (primary) hypertension: Secondary | ICD-10-CM | POA: Diagnosis not present

## 2017-06-16 DIAGNOSIS — Z91040 Latex allergy status: Secondary | ICD-10-CM | POA: Diagnosis not present

## 2017-06-16 DIAGNOSIS — X58XXXD Exposure to other specified factors, subsequent encounter: Secondary | ICD-10-CM | POA: Diagnosis not present

## 2017-06-16 LAB — COMPREHENSIVE METABOLIC PANEL
ALK PHOS: 88 U/L (ref 38–126)
ALT: 14 U/L (ref 14–54)
ANION GAP: 10 (ref 5–15)
AST: 23 U/L (ref 15–41)
Albumin: 3.9 g/dL (ref 3.5–5.0)
BUN: 38 mg/dL — ABNORMAL HIGH (ref 6–20)
CALCIUM: 9.1 mg/dL (ref 8.9–10.3)
CO2: 28 mmol/L (ref 22–32)
Chloride: 99 mmol/L — ABNORMAL LOW (ref 101–111)
Creatinine, Ser: 1.59 mg/dL — ABNORMAL HIGH (ref 0.44–1.00)
GFR calc Af Amer: 36 mL/min — ABNORMAL LOW (ref >60)
GFR calc non Af Amer: 31 mL/min — ABNORMAL LOW (ref >60)
GLUCOSE: 132 mg/dL — AB (ref 65–99)
Potassium: 4.2 mmol/L (ref 3.5–5.1)
Sodium: 137 mmol/L (ref 135–145)
TOTAL PROTEIN: 8.2 g/dL — AB (ref 6.5–8.1)
Total Bilirubin: 0.6 mg/dL (ref 0.3–1.2)

## 2017-06-16 LAB — CBC
HEMATOCRIT: 38.5 % (ref 35.0–47.0)
HEMOGLOBIN: 13.2 g/dL (ref 12.0–16.0)
MCH: 31.7 pg (ref 26.0–34.0)
MCHC: 34.2 g/dL (ref 32.0–36.0)
MCV: 92.8 fL (ref 80.0–100.0)
Platelets: 461 10*3/uL — ABNORMAL HIGH (ref 150–440)
RBC: 4.15 MIL/uL (ref 3.80–5.20)
RDW: 13.4 % (ref 11.5–14.5)
WBC: 8.2 10*3/uL (ref 3.6–11.0)

## 2017-06-16 NOTE — ED Notes (Signed)
Pt to triage via w/c with no distress noted, brought in by EMS from [facility] for c/o of "scratch" to leg that is not healing

## 2017-06-16 NOTE — ED Triage Notes (Signed)
Pt to triage via wheelchair.  Pt brought in via ems form [facility].  Pt has pain in right arm from a fall last week.  Pt wearing a sling on right arm.  Pt also here for wound check of left lower leg.  Pt has redness and drainage.  Wound for 3 days.  Pt alert.

## 2017-06-17 ENCOUNTER — Emergency Department
Admission: EM | Admit: 2017-06-17 | Discharge: 2017-06-17 | Disposition: A | Discharge status: Home / Self Care | Payer: Medicare Other | Attending: Emergency Medicine | Admitting: Emergency Medicine

## 2017-06-17 DIAGNOSIS — L03116 Cellulitis of left lower limb: Secondary | ICD-10-CM | POA: Diagnosis not present

## 2017-06-17 MED ORDER — CEPHALEXIN 500 MG PO CAPS: 500.0000 mg | ORAL_CAPSULE | Freq: Two times a day (BID) | ORAL | 0 refills | Status: AC

## 2017-06-17 MED ORDER — CEPHALEXIN 500 MG PO CAPS
500.0000 mg | ORAL_CAPSULE | Freq: Once | ORAL | Status: AC
  Administered 2017-06-17: 500 mg via ORAL
  Filled 2017-06-17: qty 1

## 2017-06-17 NOTE — ED Notes (Signed)
Pt states her son can not transport pt home.

## 2017-06-17 NOTE — ED Provider Notes (Signed)
[facility] Emergency Department Provider Note    First MD Initiated Contact with Patient 06/17/17 [phone]     (approximate)  I have reviewed the triage vital signs and the nursing notes.   HISTORY  Chief Complaint Wound Check and Arm Injury    HPI Andrea Barker is a 77 y.o. female with below list of chronic medical conditions presents to the emergency department via EMS from [facility] with left lower extremity wound which patient states that she sustained 3 days ago secondary to a "scratch".  Patient admits to purulent drainage answer site.  Patient denies any fever   Past Medical History:  Diagnosis Date  . Anxiety   . Arthritis   . GERD (gastroesophageal reflux disease)   . Hypertension   . Seasonal allergic rhinitis     There are no active problems to display for this patient.   Past Surgical History:  Procedure Laterality Date  . PARTIAL HYSTERECTOMY      Prior to Admission medications   Medication Sig Start Date End Date Taking? Authorizing Provider  amLODipine (NORVASC) 10 MG tablet Take 10 mg by mouth daily.    [provider]  cephALEXin (KEFLEX) 500 MG capsule Take 1 capsule (500 mg total) by mouth 2 (two) times daily for 10 days. 06/17/17 06/27/17  Darci Current, MD  doxycycline (VIBRAMYCIN) 100 MG capsule Take 1 capsule (100 mg total) by mouth 2 (two) times daily. 11/05/15   Hassan Rowan, MD  esomeprazole (NEXIUM) 20 MG capsule Take 20 mg by mouth daily at 12 noon.    [provider]  Esomeprazole Magnesium 20 MG TBEC Take by mouth.    [provider]  fentaNYL (DURAGESIC - DOSED MCG/HR) 100 MCG/HR Place 150 mcg onto the skin every 3 (three) days.    [provider]  fentaNYL (DURAGESIC - DOSED MCG/HR) 50 MCG/HR Place onto the skin.    [provider]  fexofenadine-pseudoephedrine (ALLEGRA-D 24) 180-240 MG 24 hr tablet Take 1 tablet by mouth daily.    [provider]  fluticasone  (FLONASE) 50 MCG/ACT nasal spray Place into both nostrils daily.    [provider]  hydrochlorothiazide (HYDRODIURIL) 25 MG tablet Take 25 mg by mouth daily.    [provider]  IRON PO Take by mouth.    [provider]  lidocaine (LIDODERM) 5 % Place 1 patch onto the skin daily. Remove & Discard patch within 12 hours or as directed by MD    [provider]  losartan (COZAAR) 50 MG tablet Take 50 mg by mouth daily.    [provider]  mupirocin ointment (BACTROBAN) 2 % Apply 1 application topically 3 (three) times daily. 11/05/15   Hassan Rowan, MD  ondansetron (ZOFRAN ODT) 8 MG disintegrating tablet Take 1 tablet (8 mg total) by mouth every 8 (eight) hours as needed for nausea or vomiting. 11/05/15   Hassan Rowan, MD  oxyCODONE-acetaminophen (PERCOCET) 10-325 MG tablet Take 1 tablet by mouth every 4 (four) hours as needed for pain.    [provider]  psyllium (METAMUCIL) 58.6 % packet Take 1 packet by mouth daily.    [provider]  sertraline (ZOLOFT) 50 MG tablet Take 50 mg by mouth daily.    [provider]    Allergies Avelox [moxifloxacin hcl in nacl]; Calamine; Clindamycin; Clindamycin/lincomycin; Doxepin; Fluorouracil; Levaquin [levofloxacin]; Minocycline; Penicillins; Sulfa antibiotics; Vancomycin; Latex; and Neomycin-bacitracin zn-polymyx  No family history on file.  Social History Social  History   Tobacco Use  . Smoking status: Former Games developer  . Smokeless tobacco: Never Used  Substance Use Topics  . Alcohol use: Yes    Comment: occaional  . Drug use: No    Review of Systems Constitutional: No fever/chills Eyes: No visual changes. ENT: No sore throat. Cardiovascular: Denies chest pain. Respiratory: Denies shortness of breath. Gastrointestinal: No abdominal pain.  No nausea, no vomiting.  No diarrhea.  No constipation. Genitourinary: Negative for dysuria. Musculoskeletal: Negative for neck pain.   Negative for back pain. Integumentary: Negative for rash. Neurological: Negative for headaches, focal weakness or numbness.   ____________________________________________   PHYSICAL EXAM:  VITAL SIGNS: ED Triage Vitals  Enc Vitals Group     BP 06/16/17 2212 (!) 163/63     Pulse Rate 06/16/17 2212 73     Resp 06/16/17 2212 20     Temp 06/16/17 2212 98.3 F (36.8 C)     Temp Source 06/16/17 2212 Oral     SpO2 06/16/17 2212 98 %     Weight 06/16/17 2212 57.6 kg (127 lb)     Height 06/16/17 2212 1.499 m (4\' 11" )     Head Circumference --      Peak Flow --      Pain Score 06/16/17 2211 7     Pain Loc --      Pain Edu? --      Excl. in GC? --     Constitutional: Alert and oriented. Well appearing and in no acute distress. Eyes: Conjunctivae are normal.  Head: Atraumatic. Mouth/Throat: Mucous membranes are moist. Neck: No stridor. Cardiovascular: Normal rate, regular rhythm. Good peripheral circulation. Grossly normal heart sounds. Respiratory: Normal respiratory effort.  No retractions. Lungs CTAB. Gastrointestinal: Soft and nontender. No distention.  Musculoskeletal: No lower extremity tenderness nor edema. No gross deformities of extremities. Neurologic:  Normal speech and language. No gross focal neurologic deficits are appreciated.  Skin: 3 distinct lesions erythematous areas on the left anterior lower extremity with purulent drainage. Psychiatric: Mood and affect are normal. Speech and behavior are normal.  ____________________________________________   LABS (all labs ordered are listed, but only abnormal results are displayed)  Labs Reviewed  CBC - Abnormal; Notable for the following components:      Result Value   Platelets 461 (*)    All other components within normal limits  COMPREHENSIVE METABOLIC PANEL - Abnormal; Notable for the following components:   Chloride 99 (*)    Glucose, Bld 132 (*)    BUN 38 (*)    Creatinine, Ser 1.59 (*)    Total Protein 8.2  (*)    GFR calc non Af Amer 31 (*)    GFR calc Af Amer 36 (*)    All other components within normal limits     Procedures   ____________________________________________   INITIAL IMPRESSION / ASSESSMENT AND PLAN / ED COURSE  As part of my medical decision making, I reviewed the following data within the electronic MEDICAL RECORD [id number] year old female present with history and physical exam consistent with left lower extremity cellulitis second to an abrasion.  Patient given Keflex in the emergency department without any reaction as such patient will be prescribed Keflex for home. ____________________________________________  FINAL CLINICAL IMPRESSION(S) / ED DIAGNOSES  Final diagnoses:  Left leg cellulitis     MEDICATIONS GIVEN DURING THIS VISIT:  Medications  cephALEXin (KEFLEX) capsule 500 mg (500 mg Oral Given 06/17/17 0401)     ED Discharge Orders  Ordered    cephALEXin (KEFLEX) 500 MG capsule  2 times daily     06/17/17 0541       Note:  This document was prepared using Dragon voice recognition software and may include unintentional dictation errors.    Darci Current, MD 06/17/17 [phone]

## 2017-06-17 NOTE — ED Notes (Signed)

## 2017-06-17 NOTE — ED Notes (Signed)
This RN spoke with Gunnar Fusi who states pt can not be transported by [facility] and "will have to come back by EMS."

## 2020-04-25 ENCOUNTER — Other Ambulatory Visit: Payer: Self-pay | Admitting: Family Medicine

## 2020-04-25 DIAGNOSIS — M84351A Stress fracture, right femur, initial encounter for fracture: Secondary | ICD-10-CM

## 2020-05-10 ENCOUNTER — Other Ambulatory Visit: Payer: Self-pay

## 2020-05-10 ENCOUNTER — Ambulatory Visit
Admission: RE | Admit: 2020-05-10 | Discharge: 2020-05-10 | Disposition: A | Discharge status: Home / Self Care | Payer: Medicare Other | Source: Ambulatory Visit | Attending: Family Medicine | Admitting: Family Medicine

## 2020-05-10 DIAGNOSIS — M84351A Stress fracture, right femur, initial encounter for fracture: Secondary | ICD-10-CM | POA: Diagnosis present

## 2020-07-31 ENCOUNTER — Other Ambulatory Visit: Payer: Self-pay

## 2020-07-31 ENCOUNTER — Emergency Department: Payer: Medicare Other

## 2020-07-31 ENCOUNTER — Observation Stay: Payer: Medicare Other

## 2020-07-31 ENCOUNTER — Inpatient Hospital Stay
Admission: EM | Admit: 2020-07-31 | Discharge: 2020-08-03 | DRG: 689 | Disposition: A | Discharge status: Skilled Nursing Facility | Payer: Medicare Other | Source: Skilled Nursing Facility | Attending: Obstetrics and Gynecology | Admitting: Obstetrics and Gynecology

## 2020-07-31 DIAGNOSIS — I10 Essential (primary) hypertension: Secondary | ICD-10-CM | POA: Diagnosis not present

## 2020-07-31 DIAGNOSIS — F0390 Unspecified dementia without behavioral disturbance: Secondary | ICD-10-CM | POA: Diagnosis present

## 2020-07-31 DIAGNOSIS — Z79899 Other long term (current) drug therapy: Secondary | ICD-10-CM

## 2020-07-31 DIAGNOSIS — Z91040 Latex allergy status: Secondary | ICD-10-CM

## 2020-07-31 DIAGNOSIS — R410 Disorientation, unspecified: Secondary | ICD-10-CM

## 2020-07-31 DIAGNOSIS — R4182 Altered mental status, unspecified: Secondary | ICD-10-CM | POA: Diagnosis present

## 2020-07-31 DIAGNOSIS — N3001 Acute cystitis with hematuria: Secondary | ICD-10-CM | POA: Diagnosis not present

## 2020-07-31 DIAGNOSIS — L03119 Cellulitis of unspecified part of limb: Secondary | ICD-10-CM

## 2020-07-31 DIAGNOSIS — R9089 Other abnormal findings on diagnostic imaging of central nervous system: Secondary | ICD-10-CM | POA: Diagnosis present

## 2020-07-31 DIAGNOSIS — Z66 Do not resuscitate: Secondary | ICD-10-CM | POA: Diagnosis present

## 2020-07-31 DIAGNOSIS — T426X5A Adverse effect of other antiepileptic and sedative-hypnotic drugs, initial encounter: Secondary | ICD-10-CM | POA: Diagnosis present

## 2020-07-31 DIAGNOSIS — K219 Gastro-esophageal reflux disease without esophagitis: Secondary | ICD-10-CM | POA: Diagnosis present

## 2020-07-31 DIAGNOSIS — Z8673 Personal history of transient ischemic attack (TIA), and cerebral infarction without residual deficits: Secondary | ICD-10-CM

## 2020-07-31 DIAGNOSIS — N1832 Chronic kidney disease, stage 3b: Secondary | ICD-10-CM | POA: Diagnosis present

## 2020-07-31 DIAGNOSIS — Z882 Allergy status to sulfonamides status: Secondary | ICD-10-CM

## 2020-07-31 DIAGNOSIS — G9341 Metabolic encephalopathy: Secondary | ICD-10-CM | POA: Diagnosis present

## 2020-07-31 DIAGNOSIS — M069 Rheumatoid arthritis, unspecified: Secondary | ICD-10-CM | POA: Diagnosis present

## 2020-07-31 DIAGNOSIS — I129 Hypertensive chronic kidney disease with stage 1 through stage 4 chronic kidney disease, or unspecified chronic kidney disease: Secondary | ICD-10-CM | POA: Diagnosis present

## 2020-07-31 DIAGNOSIS — Z87891 Personal history of nicotine dependence: Secondary | ICD-10-CM

## 2020-07-31 DIAGNOSIS — Z7401 Bed confinement status: Secondary | ICD-10-CM

## 2020-07-31 DIAGNOSIS — M549 Dorsalgia, unspecified: Secondary | ICD-10-CM | POA: Diagnosis present

## 2020-07-31 DIAGNOSIS — Z880 Allergy status to penicillin: Secondary | ICD-10-CM

## 2020-07-31 DIAGNOSIS — I872 Venous insufficiency (chronic) (peripheral): Secondary | ICD-10-CM | POA: Diagnosis present

## 2020-07-31 DIAGNOSIS — N183 Chronic kidney disease, stage 3 unspecified: Secondary | ICD-10-CM

## 2020-07-31 DIAGNOSIS — J302 Other seasonal allergic rhinitis: Secondary | ICD-10-CM | POA: Diagnosis present

## 2020-07-31 DIAGNOSIS — R627 Adult failure to thrive: Secondary | ICD-10-CM | POA: Diagnosis present

## 2020-07-31 DIAGNOSIS — Z20822 Contact with and (suspected) exposure to covid-19: Secondary | ICD-10-CM | POA: Diagnosis present

## 2020-07-31 DIAGNOSIS — B964 Proteus (mirabilis) (morganii) as the cause of diseases classified elsewhere: Secondary | ICD-10-CM | POA: Diagnosis present

## 2020-07-31 DIAGNOSIS — F32A Depression, unspecified: Secondary | ICD-10-CM | POA: Diagnosis present

## 2020-07-31 DIAGNOSIS — G928 Other toxic encephalopathy: Secondary | ICD-10-CM | POA: Diagnosis present

## 2020-07-31 DIAGNOSIS — G8929 Other chronic pain: Secondary | ICD-10-CM | POA: Diagnosis present

## 2020-07-31 DIAGNOSIS — R5381 Other malaise: Secondary | ICD-10-CM | POA: Diagnosis present

## 2020-07-31 DIAGNOSIS — Z888 Allergy status to other drugs, medicaments and biological substances status: Secondary | ICD-10-CM

## 2020-07-31 DIAGNOSIS — J9601 Acute respiratory failure with hypoxia: Secondary | ICD-10-CM | POA: Diagnosis present

## 2020-07-31 DIAGNOSIS — Z881 Allergy status to other antibiotic agents status: Secondary | ICD-10-CM

## 2020-07-31 DIAGNOSIS — Z79891 Long term (current) use of opiate analgesic: Secondary | ICD-10-CM

## 2020-07-31 LAB — URINALYSIS, COMPLETE (UACMP) WITH MICROSCOPIC
Bilirubin Urine: NEGATIVE
Glucose, UA: NEGATIVE mg/dL
Hgb urine dipstick: NEGATIVE
Ketones, ur: NEGATIVE mg/dL
Nitrite: NEGATIVE
Protein, ur: 100 mg/dL — AB
Specific Gravity, Urine: 1.017 (ref 1.005–1.030)
WBC, UA: >50 WBC/hpf — ABNORMAL HIGH (ref 0–5)
pH: 6 (ref 5.0–8.0)

## 2020-07-31 LAB — CBC WITH DIFFERENTIAL/PLATELET
Abs Immature Granulocytes: 0.1 10*3/uL — ABNORMAL HIGH (ref 0.00–0.07)
Basophils Absolute: 0 10*3/uL (ref 0.0–0.1)
Basophils Relative: 0 %
Eosinophils Absolute: 0 10*3/uL (ref 0.0–0.5)
Eosinophils Relative: 0 %
HCT: 41.3 % (ref 36.0–46.0)
Hemoglobin: 13.5 g/dL (ref 12.0–15.0)
Immature Granulocytes: 1 %
Lymphocytes Relative: 13 %
Lymphs Abs: 2.3 10*3/uL (ref 0.7–4.0)
MCH: 29.2 pg (ref 26.0–34.0)
MCHC: 32.7 g/dL (ref 30.0–36.0)
MCV: 89.4 fL (ref 80.0–100.0)
Monocytes Absolute: 1.3 10*3/uL — ABNORMAL HIGH (ref 0.1–1.0)
Monocytes Relative: 8 %
Neutro Abs: 13.8 10*3/uL — ABNORMAL HIGH (ref 1.7–7.7)
Neutrophils Relative %: 78 %
Platelets: 508 10*3/uL — ABNORMAL HIGH (ref 150–400)
RBC: 4.62 MIL/uL (ref 3.87–5.11)
RDW: 13.8 % (ref 11.5–15.5)
WBC: 17.6 10*3/uL — ABNORMAL HIGH (ref 4.0–10.5)
nRBC: 0 % (ref 0.0–0.2)

## 2020-07-31 LAB — COMPREHENSIVE METABOLIC PANEL
ALT: 9 U/L (ref 0–44)
AST: 14 U/L — ABNORMAL LOW (ref 15–41)
Albumin: 3.2 g/dL — ABNORMAL LOW (ref 3.5–5.0)
Alkaline Phosphatase: 68 U/L (ref 38–126)
Anion gap: 10 (ref 5–15)
BUN: 46 mg/dL — ABNORMAL HIGH (ref 8–23)
CO2: 24 mmol/L (ref 22–32)
Calcium: 8.9 mg/dL (ref 8.9–10.3)
Chloride: 106 mmol/L (ref 98–111)
Creatinine, Ser: 1.4 mg/dL — ABNORMAL HIGH (ref 0.44–1.00)
GFR, Estimated: 39 mL/min — ABNORMAL LOW (ref >60)
Glucose, Bld: 141 mg/dL — ABNORMAL HIGH (ref 70–99)
Potassium: 4.3 mmol/L (ref 3.5–5.1)
Sodium: 140 mmol/L (ref 135–145)
Total Bilirubin: 0.8 mg/dL (ref 0.3–1.2)
Total Protein: 8.2 g/dL — ABNORMAL HIGH (ref 6.5–8.1)

## 2020-07-31 LAB — LACTIC ACID, PLASMA: Lactic Acid, Venous: 0.9 mmol/L (ref 0.5–1.9)

## 2020-07-31 LAB — APTT: aPTT: 45 seconds — ABNORMAL HIGH (ref 24–36)

## 2020-07-31 LAB — TROPONIN I (HIGH SENSITIVITY)
Troponin I (High Sensitivity): 4 ng/L (ref <18)
Troponin I (High Sensitivity): 7 ng/L (ref <18)

## 2020-07-31 LAB — RESP PANEL BY RT-PCR (FLU A&B, COVID) ARPGX2
Influenza A by PCR: NEGATIVE
Influenza B by PCR: NEGATIVE
SARS Coronavirus 2 by RT PCR: NEGATIVE

## 2020-07-31 LAB — PROTIME-INR
INR: 1.2 (ref 0.8–1.2)
Prothrombin Time: 15.2 seconds (ref 11.4–15.2)

## 2020-07-31 LAB — PROCALCITONIN: Procalcitonin: 0.15 ng/mL

## 2020-07-31 MED ORDER — SERTRALINE HCL 50 MG PO TABS
50.0000 mg | ORAL_TABLET | Freq: Every day | ORAL | Status: DC
  Administered 2020-08-01 – 2020-08-02 (×2): 50 mg via ORAL
  Filled 2020-07-31 (×2): qty 1

## 2020-07-31 MED ORDER — SODIUM CHLORIDE 0.9 % IV SOLN
1.0000 g | Freq: Once | INTRAVENOUS | Status: AC
  Administered 2020-07-31: 1 g via INTRAVENOUS
  Filled 2020-07-31: qty 10

## 2020-07-31 MED ORDER — ONDANSETRON HCL 4 MG/2ML IJ SOLN: 4.0000 mg | Freq: Four times a day (QID) | INTRAMUSCULAR | Status: DC | PRN

## 2020-07-31 MED ORDER — PANTOPRAZOLE SODIUM 40 MG PO TBEC
40.0000 mg | DELAYED_RELEASE_TABLET | Freq: Every day | ORAL | Status: DC
  Administered 2020-08-01 – 2020-08-03 (×3): 40 mg via ORAL
  Filled 2020-07-31 (×3): qty 1

## 2020-07-31 MED ORDER — LACTATED RINGERS IV BOLUS
500.0000 mL | Freq: Once | INTRAVENOUS | Status: AC
  Administered 2020-07-31: 500 mL via INTRAVENOUS

## 2020-07-31 MED ORDER — AMLODIPINE BESYLATE 10 MG PO TABS
10.0000 mg | ORAL_TABLET | Freq: Every day | ORAL | Status: DC
  Administered 2020-08-01 – 2020-08-03 (×3): 10 mg via ORAL
  Filled 2020-07-31 (×3): qty 1

## 2020-07-31 MED ORDER — LINEZOLID 600 MG/300ML IV SOLN
600.0000 mg | Freq: Two times a day (BID) | INTRAVENOUS | Status: DC
  Filled 2020-07-31 (×2): qty 300

## 2020-07-31 MED ORDER — SODIUM CHLORIDE 0.45 % IV SOLN: INTRAVENOUS | Status: DC

## 2020-07-31 MED ORDER — LORATADINE 10 MG PO TABS
10.0000 mg | ORAL_TABLET | Freq: Every day | ORAL | Status: DC
  Administered 2020-08-01 – 2020-08-03 (×3): 10 mg via ORAL
  Filled 2020-07-31 (×3): qty 1

## 2020-07-31 MED ORDER — DOCUSATE SODIUM 100 MG PO CAPS
200.0000 mg | ORAL_CAPSULE | Freq: Every day | ORAL | Status: DC
  Administered 2020-08-01 – 2020-08-03 (×3): 200 mg via ORAL
  Filled 2020-07-31 (×3): qty 2

## 2020-07-31 MED ORDER — ACETAMINOPHEN 650 MG RE SUPP: 325.0000 mg | Freq: Four times a day (QID) | RECTAL | Status: DC | PRN

## 2020-07-31 MED ORDER — LACTATED RINGERS IV BOLUS (SEPSIS)
1000.0000 mL | Freq: Once | INTRAVENOUS | Status: AC
  Administered 2020-07-31: 1000 mL via INTRAVENOUS

## 2020-07-31 MED ORDER — ONDANSETRON HCL 4 MG PO TABS
4.0000 mg | ORAL_TABLET | Freq: Four times a day (QID) | ORAL | Status: DC | PRN
  Administered 2020-08-03: 4 mg via ORAL
  Filled 2020-07-31: qty 1

## 2020-07-31 MED ORDER — ASCORBIC ACID 500 MG PO TABS
250.0000 mg | ORAL_TABLET | Freq: Every day | ORAL | Status: DC
  Administered 2020-08-01 – 2020-08-03 (×3): 250 mg via ORAL
  Filled 2020-07-31 (×3): qty 1

## 2020-07-31 MED ORDER — SODIUM CHLORIDE 0.9 % IV SOLN
1.0000 g | INTRAVENOUS | Status: DC
  Administered 2020-08-01 – 2020-08-02 (×2): 1 g via INTRAVENOUS
  Filled 2020-07-31 (×3): qty 10

## 2020-07-31 MED ORDER — ENOXAPARIN SODIUM 30 MG/0.3ML ~~LOC~~ SOLN
30.0000 mg | SUBCUTANEOUS | Status: DC
  Administered 2020-08-01 – 2020-08-02 (×2): 30 mg via SUBCUTANEOUS
  Filled 2020-07-31 (×2): qty 0.3

## 2020-07-31 MED ORDER — HYDROCHLOROTHIAZIDE 25 MG PO TABS
12.5000 mg | ORAL_TABLET | Freq: Every day | ORAL | Status: DC
  Administered 2020-08-01 – 2020-08-03 (×3): 12.5 mg via ORAL
  Filled 2020-07-31 (×2): qty 1

## 2020-07-31 MED ORDER — ACETAMINOPHEN 325 MG PO TABS
325.0000 mg | ORAL_TABLET | Freq: Four times a day (QID) | ORAL | Status: DC | PRN
  Administered 2020-08-02: 325 mg via ORAL
  Filled 2020-07-31: qty 1

## 2020-07-31 MED ORDER — ADULT MULTIVITAMIN W/MINERALS CH
1.0000 | ORAL_TABLET | Freq: Every day | ORAL | Status: DC
  Administered 2020-08-01 – 2020-08-03 (×3): 1 via ORAL
  Filled 2020-07-31 (×3): qty 1

## 2020-07-31 NOTE — ED Provider Notes (Signed)
[facility] Emergency Department Provider Note  ____________________________________________   Event Date/Time   First MD Initiated Contact with Patient 07/31/20 1538     (approximate)  I have reviewed the triage vital signs and the nursing notes.   HISTORY  Chief Complaint Shortness of Breath, Weakness, and Altered Mental Status   HPI ELLEE WAWRZYNIAK is a 80 y.o. female with a past medical history of of CVA, dementia, HTN, Crohn's, RA, chronic pain, osteoarthritis, scoliosis, and who presents EMS from [facility] for assessment of altered mental status and complaints of shortness of breath.  On arrival patient endorses some cough but is unable provide any additional history.  Unclear if this is baseline or new for patient to some amatory staff or family for additional history.  No additional history is available on patient arrival secondary altered mental status.       Past Medical History:  Diagnosis Date  . Anxiety   . Arthritis   . GERD (gastroesophageal reflux disease)   . Hypertension   . Seasonal allergic rhinitis     Patient Active Problem List   Diagnosis Date Noted  . Altered mental status 07/31/2020    Past Surgical History:  Procedure Laterality Date  . PARTIAL HYSTERECTOMY      Prior to Admission medications   Medication Sig Start Date End Date Taking? Authorizing Provider  amLODipine (NORVASC) 10 MG tablet Take 10 mg by mouth daily.    [provider]  doxycycline (VIBRAMYCIN) 100 MG capsule Take 1 capsule (100 mg total) by mouth 2 (two) times daily. 11/05/15   Hassan Rowan, MD  esomeprazole (NEXIUM) 20 MG capsule Take 20 mg by mouth daily at 12 noon.    [provider]  Esomeprazole Magnesium 20 MG TBEC Take by mouth.    [provider]  fentaNYL (DURAGESIC - DOSED MCG/HR) 100 MCG/HR Place 150 mcg onto the skin every 3 (three) days.    [provider]  fentaNYL  (DURAGESIC - DOSED MCG/HR) 50 MCG/HR Place onto the skin.    [provider]  fexofenadine-pseudoephedrine (ALLEGRA-D 24) 180-240 MG 24 hr tablet Take 1 tablet by mouth daily.    [provider]  fluticasone (FLONASE) 50 MCG/ACT nasal spray Place into both nostrils daily.    [provider]  hydrochlorothiazide (HYDRODIURIL) 25 MG tablet Take 25 mg by mouth daily.    [provider]  IRON PO Take by mouth.    [provider]  lidocaine (LIDODERM) 5 % Place 1 patch onto the skin daily. Remove & Discard patch within 12 hours or as directed by MD    [provider]  losartan (COZAAR) 50 MG tablet Take 50 mg by mouth daily.    [provider]  mupirocin ointment (BACTROBAN) 2 % Apply 1 application topically 3 (three) times daily. 11/05/15   Hassan Rowan, MD  ondansetron (ZOFRAN ODT) 8 MG disintegrating tablet Take 1 tablet (8 mg total) by mouth every 8 (eight) hours as needed for nausea or vomiting. 11/05/15   Hassan Rowan, MD  oxyCODONE-acetaminophen (PERCOCET) 10-325 MG tablet Take 1 tablet by mouth every 4 (four) hours as needed for pain.    [provider]  psyllium (METAMUCIL) 58.6 % packet Take 1 packet by mouth daily.    [provider]  sertraline (ZOLOFT) 50 MG tablet Take 50 mg by mouth daily.    [provider]    Allergies Avelox [moxifloxacin hcl in nacl],  Calamine, Clindamycin, Clindamycin/lincomycin, Doxepin, Fluorouracil, Levaquin [levofloxacin], Minocycline, Penicillins, Sulfa antibiotics, Vancomycin, Latex, and Neomycin-bacitracin zn-polymyx  No family history on file.  Social History Social History   Tobacco Use  . Smoking status: Former Games developer  . Smokeless tobacco: Never Used  Substance Use Topics  . Alcohol use: Yes    Comment: occaional  . Drug use: No    Review of Systems  Review of Systems  Unable to perform ROS: Mental status change  Respiratory: Positive for cough.        ____________________________________________   PHYSICAL EXAM:  VITAL SIGNS: ED Triage Vitals [07/31/20 1452]  Enc Vitals Group     BP 116/83     Pulse Rate (!) 53     Resp (!) 22     Temp 98.5 F (36.9 C)     Temp Source Axillary     SpO2 93 %     Weight 126 lb 15.8 oz (57.6 kg)     Height      Head Circumference      [facility]      Pain Score      Pain Loc      Pain Edu?      Excl. in GC?    Vitals:   07/31/20 1630 07/31/20 1700  BP: (!) 112/55 (!) 114/50  Pulse: 96 96  Resp: 16 16  Temp:    SpO2: 90% 90%   Physical Exam Vitals and nursing note reviewed.  Constitutional:      General: She is not in acute distress.    Appearance: She is well-developed and well-nourished.  HENT:     Head: Normocephalic and atraumatic.     Right Ear: External ear normal.     Left Ear: External ear normal.     Mouth/Throat:     Mouth: Mucous membranes are dry.  Eyes:     Conjunctiva/sclera: Conjunctivae normal.  Cardiovascular:     Rate and Rhythm: Normal rate and regular rhythm.     Heart sounds: No murmur heard.   Pulmonary:     Effort: Pulmonary effort is normal. No respiratory distress.     Breath sounds: Normal breath sounds.  Abdominal:     Palpations: Abdomen is soft.     Tenderness: There is no abdominal tenderness.  Musculoskeletal:        General: No edema.     Cervical back: Neck supple.  Skin:    General: Skin is warm and dry.     Capillary Refill: Capillary refill takes 2 to 3 seconds.  Neurological:     Mental Status: She is alert. She is disoriented and confused.  Psychiatric:        Mood and Affect: Mood and affect normal.     Patient is able to give this examiner thumbs up with both hands.  PERRLA.  EOMI.  She is able to move her toes in both lower extremities on command.  Sensation is intact to light touch of extremities.  2+ bilateral radial and DP pulses  Patient does have extensive skin breakdown over her bilateral lower legs this time  ulceration granulation tissue and small areas of purulence.     ____________________________________________   LABS (all labs ordered are listed, but only abnormal results are displayed)  Labs Reviewed  COMPREHENSIVE METABOLIC PANEL - Abnormal; Notable for the following components:      Result Value   Glucose, Bld 141 (*)    BUN 46 (*)    Creatinine, Ser  1.40 (*)    Total Protein 8.2 (*)    Albumin 3.2 (*)    AST 14 (*)    GFR, Estimated 39 (*)    All other components within normal limits  CBC WITH DIFFERENTIAL/PLATELET - Abnormal; Notable for the following components:   WBC 17.6 (*)    Platelets 508 (*)    Neutro Abs 13.8 (*)    Monocytes Absolute 1.3 (*)    Abs Immature Granulocytes 0.10 (*)    All other components within normal limits  URINALYSIS, COMPLETE (UACMP) WITH MICROSCOPIC - Abnormal; Notable for the following components:   Color, Urine AMBER (*)    APPearance CLOUDY (*)    Protein, ur 100 (*)    Leukocytes,Ua LARGE (*)    WBC, UA >50 (*)    Bacteria, UA FEW (*)    All other components within normal limits  RESP PANEL BY RT-PCR (FLU A&B, COVID) ARPGX2  URINE CULTURE  LACTIC ACID, PLASMA  PROCALCITONIN  PROTIME-INR  APTT  TROPONIN I (HIGH SENSITIVITY)  TROPONIN I (HIGH SENSITIVITY)   ____________________________________________  EKG  Sinus rhythm with a ventricular rate of 86, normal axis, unremarkable intervals, nonspecific changes in the septal leads without other clear evidence of acute ischemia. ____________________________________________  RADIOLOGY  ED MD interpretation: No large effusion, full consolidation, pulmonary edema or other clear acute intrathoracic process.  There is elevation of the right hemidiaphragm.  Official radiology report(s): DG Chest 2 View  Result Date: 07/31/2020 CLINICAL DATA:  Cough and chest congestion EXAM: CHEST - 2 VIEW COMPARISON:  12/20/2011 FINDINGS: Cardiac shadow is stable. Aortic calcifications are again  seen. Elevation of the right hemidiaphragm is noted. Lungs are clear. No sizable effusion is noted. IMPRESSION: No active cardiopulmonary disease. Electronically Signed   By: Alcide Clever M.D.   On: 07/31/2020 15:56   CT Head Wo Contrast  Result Date: 07/31/2020 CLINICAL DATA:  Mental status changes, unknown cause. Hypoxia. Lethargy. EXAM: CT HEAD WITHOUT CONTRAST TECHNIQUE: Contiguous axial images were obtained from the base of the skull through the vertex without intravenous contrast. COMPARISON:  None. FINDINGS: Brain: Moderate central and cortical atrophy. Prominent LATERAL and third ventricles. There is periventricular white matter change. There is no intra or extra-axial fluid collection or mass lesion. The basilar cisterns and ventricles have a normal appearance. There is no CT evidence for acute infarction or hemorrhage. Vascular: There is atherosclerotic calcification of the internal carotid arteries. Skull: Normal. Negative for fracture or focal lesion. Sinuses/Orbits: No acute finding. Other: None. IMPRESSION: 1. No evidence for acute intracranial abnormality. 2. Atrophy and small vessel disease. 3. Prominent LATERAL and third ventricles may be related to central and cortical atrophy. However, normal pressure hydrocephalus is not excluded. Consider further evaluation with MRI. Electronically Signed   By: Norva Pavlov M.D.   On: 07/31/2020 16:34    ____________________________________________   PROCEDURES  Procedure(s) performed (including Critical Care):  .1-3 Lead EKG Interpretation Performed by: Gilles Chiquito, MD Authorized by: Gilles Chiquito, MD     Interpretation: normal     ECG rate assessment: normal     Rhythm: sinus rhythm     Ectopy: none     Conduction: normal       ____________________________________________   INITIAL IMPRESSION / ASSESSMENT AND PLAN / ED COURSE        Patient presents with above to history exam for assessment of altered mental  status and complaints of shortness of breath.  On arrival patient is not  oriented only complains of cough.  She denies any acute pain.  She was reported placed on 4 L nasal cannula from EMS although she was downgraded to room air on arrival and maintain her sats in the low 90s.  She otherwise has a BP of 106/58 and otherwise stable vital signs.  She is a nonfocal neuro exam but is not oriented.  I am unable to reach staff x3 attempts as well as family history in the chart x1 to obtain additional history.  CT head shows no acute process.  In addition patient has no clear focal deficits on exam to suggest CVA.  No clear evidence of trauma on exam.   Chest x-ray does not show evidence of pneumonia.  CMP shows no significant electrolyte or metabolic derangements.  Lactic acid is nonelevated.  CBC shows leukocytosis with WBC count of 17.6 and no acute hemoglobin.  Platelets elevated 508.  UA does appear infected with large leukocyte esterase, 21-50 RBCs,.  50 WBCs and few bacteria.  No fever or CVA tenderness to suggest pyelonephritis at this time.  Procalcitonin is elevated 0.15.  Patient given IV fluids for some soft blood pressure arrival although low suspicion for sepsis bacteremia at this time.  Exam is concerning for possible concrement cellulitis in the lower extremities.  Discussed with pharmacy prep antibiotics given patient's extensive allergy list.  Recommended given allergy to vancomycin linezolid to cover for cellulitis of the legs and stated they would review her chart for recommendations to cover for cystitis.  Per EMS she is altered from baseline unable to confirm this with anyone else although she is certainly altered on arrival here.  I am concerned she may be encephalopathic possibly from multi source of infection.  I will plan to admit to medicine for further evaluation management.   ____________________________________________   FINAL CLINICAL IMPRESSION(S) / ED DIAGNOSES  Final  diagnoses:  Acute cystitis with hematuria  Altered mental status, unspecified altered mental status type  Cellulitis of lower extremity, unspecified laterality    Medications  linezolid (ZYVOX) IVPB 600 mg (has no administration in time range)  lactated ringers bolus 1,000 mL (0 mLs Intravenous Stopped 07/31/20 1632)  cefTRIAXone (ROCEPHIN) 1 g in sodium chloride 0.9 % 100 mL IVPB (1 g Intravenous New Bag/Given 07/31/20 1717)     ED Discharge Orders    None       Note:  This document was prepared using Dragon voice recognition software and may include unintentional dictation errors.   Gilles Chiquito, MD 07/31/20 [phone]

## 2020-07-31 NOTE — H&P (Addendum)
History and Physical   Andrea Barker:[id number] DOB: 09-Sep-1940 DOA: 07/31/2020  PCP: Almetta Lovely, [facility]  Patient coming from: [facility], long term facility   I have personally briefly reviewed patient's old medical records in [facility] EMR.  Chief Concern: Mental status change  HPI: Andrea Barker is a 80 y.o. female with medical history significant for bedridden, since 2015, chronic bilateral leg skin changes at baseline, hypertension, chronic back pain, depression, presented to [facility] for chief concern of altered mentation this AM and hypoxia.   [facility] did not report to daughter how low the O2 dropped. Per daughter-in-law at bedside, patient is not on baseline O2 supplementation.  At bedside, her O2 was 88-92%.  On physical exam, she had suprapubic tenderness.  Social history: Patient is divorced and patient only has 1 son.  Per son and daughter-in-law, patient has DNR order and they will bring it in the a.m.  I confirm DNR status with son and daughter-in-law  ROS: unable to complete as patient has moderate to severe dementia  ED Course: Discussed with ED provider, patient requiring hospitalization due to metabolic encephalopathy.  Vitals in the emergency department with 98.5, respiration rate of 18, heart rate of 97, blood pressure 112/55, SPO2 of 89% on 4 L nasal cannula.  Labs also remarkable for serum creatinine of 1.40, WBC of 17.6, hemoglobin of 13.5, hematocrit of 41, platelets of 508.  UA was positive for leukocytes.  CT of the head without contrast by ED provider was read as prominent lateral and third ventricles may be related to central and cortical atrophy.  However normal pressure hydrocephalus is not excluded.  Read recommended MRI for further evaluation  Assessment/Plan  Active Problems:   Altered mental status   Failure to thrive in adult   Essential hypertension   CKD (chronic kidney disease), stage III (HCC)   Debility    # Metabolic encephalopathy-suspect secondary to urinary tract infection  -Patient is not septic as lactic acid is negative, and her elevated heart rate and respiration rate and WBC is due to urinary tract infection -Chest x-ray was negative for pneumonia, baseline pro-Cal was negative -CT of the head without contrast was read as no evidence for acute intracranial abnormality -Ceftriaxone 1 g IV per ED provider, -Ceftriaxone 1 g IV every 24 hours starting 08/01/2020 -Status post 1.5 L LR bolus per ED provider -Half-normal saline 100 mL/h, 20 hours ordered  # Polypharmacy-including multiple sedating medications -Holding home gabapentin 300 mg nightly, oxycodone 10 mg every 6 hours as needed for pain, fentanyl patch every 3 days  # Hypertension-amlodipine 10 mg and hydrochlorothiazide 12.5 mg daily resumed for 08/01/2020  # Chronic bilateral lower extremity skin changes-Per daughter at bedside this is baseline for patient -Picture uploaded into media per ED provider -No indications for antibiotics at this time as this is not cellulitis  # Prominent lateral and third ventricles-MRI of the brain ordered  # Hypoxic respiratory failure - resolved   # Moderate to severe dementia-patient was able to tell me her name at bedside and identify her daughter-in-law -She was not able to tell me her age, location, current calendar year, current president of the [place]  # Depression-sertraline 50 mg nightly  # GERD-pantoprazole  # CKD 3B-at baseline  # Chart reviewed.   DVT prophylaxis: Enoxaparin Code Status: DNR, her son and daughter-in-law are the healthcare power of attorney Diet: Heart healthy Family Communication: Updated son, Mr. Montez Morita over the phone and his wife  at bedside, daughter-in-law Disposition Plan: Pending clinical course Consults called: None at this time Admission status: Observation to progressive cardiac with telemetry  Past Medical History:  Diagnosis Date  .  Anxiety   . Arthritis   . GERD (gastroesophageal reflux disease)   . Hypertension   . Seasonal allergic rhinitis    Past Surgical History:  Procedure Laterality Date  . PARTIAL HYSTERECTOMY     Social History:  reports that she has quit smoking. She has never used smokeless tobacco. She reports current alcohol use. She reports that she does not use drugs.  Allergies  Allergen Reactions  . Avelox [Moxifloxacin Hcl In Nacl] Swelling  . Calamine     RASH  . Clindamycin Diarrhea and Nausea Only  . Clindamycin/Lincomycin Nausea And Vomiting  . Doxepin     Other reaction(s): Diarrhea  . Fluorouracil     Other reaction(s): Rash  . Indomethacin Nausea And Vomiting  . Levaquin [Levofloxacin] Diarrhea  . Minocycline Diarrhea and Nausea Only  . Penicillins Swelling  . Sulfa Antibiotics Hives  . Vancomycin Swelling  . Latex Rash  . Neomycin-Bacitracin Zn-Polymyx Rash   No family history on file. Family history: Family history reviewed and not pertinent  Prior to Admission medications   Medication Sig Start Date End Date Taking? Authorizing Provider  amLODipine (NORVASC) 10 MG tablet Take 10 mg by mouth daily.    [provider]  doxycycline (VIBRAMYCIN) 100 MG capsule Take 1 capsule (100 mg total) by mouth 2 (two) times daily. 11/05/15   Hassan Rowan, MD  esomeprazole (NEXIUM) 20 MG capsule Take 20 mg by mouth daily at 12 noon.    [provider]  Esomeprazole Magnesium 20 MG TBEC Take by mouth.    [provider]  fentaNYL (DURAGESIC - DOSED MCG/HR) 100 MCG/HR Place 150 mcg onto the skin every 3 (three) days.    [provider]  fentaNYL (DURAGESIC - DOSED MCG/HR) 50 MCG/HR Place onto the skin.    [provider]  fexofenadine-pseudoephedrine (ALLEGRA-D 24) 180-240 MG 24 hr tablet Take 1 tablet by mouth daily.    [provider]  fluticasone (FLONASE) 50 MCG/ACT nasal spray Place into both nostrils daily.    [provider]   hydrochlorothiazide (HYDRODIURIL) 25 MG tablet Take 25 mg by mouth daily.    [provider]  IRON PO Take by mouth.    [provider]  lidocaine (LIDODERM) 5 % Place 1 patch onto the skin daily. Remove & Discard patch within 12 hours or as directed by MD    [provider]  losartan (COZAAR) 50 MG tablet Take 50 mg by mouth daily.    [provider]  mupirocin ointment (BACTROBAN) 2 % Apply 1 application topically 3 (three) times daily. 11/05/15   Hassan Rowan, MD  ondansetron (ZOFRAN ODT) 8 MG disintegrating tablet Take 1 tablet (8 mg total) by mouth every 8 (eight) hours as needed for nausea or vomiting. 11/05/15   Hassan Rowan, MD  oxyCODONE-acetaminophen (PERCOCET) 10-325 MG tablet Take 1 tablet by mouth every 4 (four) hours as needed for pain.    [provider]  psyllium (METAMUCIL) 58.6 % packet Take 1 packet by mouth daily.    [provider]  sertraline (ZOLOFT) 50 MG tablet Take 50 mg by mouth daily.    [provider]   Physical Exam: Vitals:   07/31/20 2030 07/31/20 2100 07/31/20 2130 07/31/20 2135  BP: (!) 99/51 (!) 85/55 (!) 88/48 Marland Kitchen)  145/81  Pulse: 87 87 85 86  Resp: 14 13 12 12   Temp:      TempSrc:      SpO2: 98% 97% 96% 96%  Weight:       Constitutional: appears age-appropriate, frail, cachectic, chronically ill, NAD, calm, comfortable Eyes: PERRL, lids and conjunctivae normal ENMT: Mucous membranes are moist. Posterior pharynx clear of any exudate or lesions. Age-appropriate dentition.  Unable to to assess as patient is minimally verbal Neck: normal, supple, no masses, no thyromegaly Respiratory: clear to auscultation bilaterally, no wheezing, no crackles. Normal respiratory effort. No accessory muscle use.  Cardiovascular: Regular rate and rhythm, no murmurs / rubs / gallops. No extremity edema. 2+ pedal pulses. No carotid bruits.  Abdomen: no tenderness, no masses palpated, no hepatosplenomegaly. Bowel  sounds positive.  Musculoskeletal: no clubbing / cyanosis. No joint deformity upper and lower extremities. Good ROM, no contractures, no atrophy. Normal muscle tone.  Skin: no rashes, lesions, ulcers. No induration.  Bilateral lower extremity skin changes, picture loaded into E HR by ED provider, this is baseline for patient Neurologic: Sensation intact. Strength 5/5 in all 4.  Appears lethargic Psychiatric: Alert and oriented x herself.  Pleasant mood.  EKG: independently reviewed, showing sinus rhythm with rate of 96, QTc 479  Chest x-ray on Admission: I personally reviewed and I agree with radiologist reading as below.  DG Chest 2 View  Result Date: 07/31/2020 CLINICAL DATA:  Cough and chest congestion EXAM: CHEST - 2 VIEW COMPARISON:  12/20/2011 FINDINGS: Cardiac shadow is stable. Aortic calcifications are again seen. Elevation of the right hemidiaphragm is noted. Lungs are clear. No sizable effusion is noted. IMPRESSION: No active cardiopulmonary disease. Electronically Signed   By: 12/22/2011 M.D.   On: 07/31/2020 15:56   CT Head Wo Contrast  Result Date: 07/31/2020 CLINICAL DATA:  Mental status changes, unknown cause. Hypoxia. Lethargy. EXAM: CT HEAD WITHOUT CONTRAST TECHNIQUE: Contiguous axial images were obtained from the base of the skull through the vertex without intravenous contrast. COMPARISON:  None. FINDINGS: Brain: Moderate central and cortical atrophy. Prominent LATERAL and third ventricles. There is periventricular white matter change. There is no intra or extra-axial fluid collection or mass lesion. The basilar cisterns and ventricles have a normal appearance. There is no CT evidence for acute infarction or hemorrhage. Vascular: There is atherosclerotic calcification of the internal carotid arteries. Skull: Normal. Negative for fracture or focal lesion. Sinuses/Orbits: No acute finding. Other: None. IMPRESSION: 1. No evidence for acute intracranial abnormality. 2. Atrophy and  small vessel disease. 3. Prominent LATERAL and third ventricles may be related to central and cortical atrophy. However, normal pressure hydrocephalus is not excluded. Consider further evaluation with MRI. Electronically Signed   By: 08/02/2020 M.D.   On: 07/31/2020 16:34   Labs on Admission: I have personally reviewed following labs  CBC: Recent Labs  Lab 07/31/20 1458  WBC 17.6*  NEUTROABS 13.8*  HGB 13.5  HCT 41.3  MCV 89.4  PLT 508*   Basic Metabolic Panel: Recent Labs  Lab 07/31/20 1458  NA 140  K 4.3  CL 106  CO2 24  GLUCOSE 141*  BUN 46*  CREATININE 1.40*  CALCIUM 8.9   GFR: CrCl cannot be calculated (Unknown ideal weight.). Liver Function Tests: Recent Labs  Lab 07/31/20 1458  AST 14*  ALT 9  ALKPHOS 68  BILITOT 0.8  PROT 8.2*  ALBUMIN 3.2*   Coagulation Profile: Recent Labs  Lab 07/31/20 1855  INR 1.2  Urine analysis:    Component Value Date/Time   COLORURINE AMBER (A) 07/31/2020 1600   APPEARANCEUR CLOUDY (A) 07/31/2020 1600   LABSPEC 1.017 07/31/2020 1600   PHURINE 6.0 07/31/2020 1600   GLUCOSEU NEGATIVE 07/31/2020 1600   HGBUR NEGATIVE 07/31/2020 1600   BILIRUBINUR NEGATIVE 07/31/2020 1600   KETONESUR NEGATIVE 07/31/2020 1600   PROTEINUR 100 (A) 07/31/2020 1600   NITRITE NEGATIVE 07/31/2020 1600   LEUKOCYTESUR LARGE (A) 07/31/2020 1600   Zonya Gudger N Javonne Louissaint D.O. [facility]  If 7PM-7AM, please contact overnight-coverage provider If 7AM-7PM, please contact day coverage provider [email or web address]  07/31/2020, 10:07 PM

## 2020-07-31 NOTE — ED Triage Notes (Addendum)
Pt in from [facility], staff reports hypoxia (sats low 90's) and lethargy/AMS since this am. 4LNC applied on arrival, axillary temp 98.5, but pt feels warm. GCS 10, VSS in triage. Deep cough present, unable to clear sputum

## 2020-07-31 NOTE — ED Triage Notes (Signed)
Pt comes into the ED via EMS from [facility] , decreased LOC and low O2 in the low 90's today.. chest congestion Pt arrives on 4L [place] 96% on 4L [place] 110/62 HR98-100 17RR 99.1 axillary temp

## 2020-08-01 DIAGNOSIS — T426X5A Adverse effect of other antiepileptic and sedative-hypnotic drugs, initial encounter: Secondary | ICD-10-CM | POA: Diagnosis present

## 2020-08-01 DIAGNOSIS — B964 Proteus (mirabilis) (morganii) as the cause of diseases classified elsewhere: Secondary | ICD-10-CM | POA: Diagnosis present

## 2020-08-01 DIAGNOSIS — J9601 Acute respiratory failure with hypoxia: Secondary | ICD-10-CM | POA: Diagnosis present

## 2020-08-01 DIAGNOSIS — Z87891 Personal history of nicotine dependence: Secondary | ICD-10-CM | POA: Diagnosis not present

## 2020-08-01 DIAGNOSIS — N1832 Chronic kidney disease, stage 3b: Secondary | ICD-10-CM | POA: Diagnosis present

## 2020-08-01 DIAGNOSIS — Z20822 Contact with and (suspected) exposure to covid-19: Secondary | ICD-10-CM | POA: Diagnosis present

## 2020-08-01 DIAGNOSIS — J302 Other seasonal allergic rhinitis: Secondary | ICD-10-CM | POA: Diagnosis present

## 2020-08-01 DIAGNOSIS — R410 Disorientation, unspecified: Secondary | ICD-10-CM | POA: Diagnosis not present

## 2020-08-01 DIAGNOSIS — G928 Other toxic encephalopathy: Secondary | ICD-10-CM | POA: Diagnosis present

## 2020-08-01 DIAGNOSIS — G8929 Other chronic pain: Secondary | ICD-10-CM | POA: Diagnosis present

## 2020-08-01 DIAGNOSIS — M549 Dorsalgia, unspecified: Secondary | ICD-10-CM | POA: Diagnosis present

## 2020-08-01 DIAGNOSIS — R627 Adult failure to thrive: Secondary | ICD-10-CM | POA: Diagnosis present

## 2020-08-01 DIAGNOSIS — G9341 Metabolic encephalopathy: Secondary | ICD-10-CM | POA: Diagnosis present

## 2020-08-01 DIAGNOSIS — R9089 Other abnormal findings on diagnostic imaging of central nervous system: Secondary | ICD-10-CM | POA: Diagnosis present

## 2020-08-01 DIAGNOSIS — Z79899 Other long term (current) drug therapy: Secondary | ICD-10-CM | POA: Diagnosis not present

## 2020-08-01 DIAGNOSIS — Z7401 Bed confinement status: Secondary | ICD-10-CM | POA: Diagnosis not present

## 2020-08-01 DIAGNOSIS — Z66 Do not resuscitate: Secondary | ICD-10-CM | POA: Diagnosis present

## 2020-08-01 DIAGNOSIS — I872 Venous insufficiency (chronic) (peripheral): Secondary | ICD-10-CM | POA: Diagnosis present

## 2020-08-01 DIAGNOSIS — F32A Depression, unspecified: Secondary | ICD-10-CM | POA: Diagnosis present

## 2020-08-01 DIAGNOSIS — M069 Rheumatoid arthritis, unspecified: Secondary | ICD-10-CM | POA: Diagnosis present

## 2020-08-01 DIAGNOSIS — K219 Gastro-esophageal reflux disease without esophagitis: Secondary | ICD-10-CM | POA: Diagnosis present

## 2020-08-01 DIAGNOSIS — F0390 Unspecified dementia without behavioral disturbance: Secondary | ICD-10-CM | POA: Diagnosis present

## 2020-08-01 DIAGNOSIS — N3001 Acute cystitis with hematuria: Secondary | ICD-10-CM | POA: Diagnosis present

## 2020-08-01 DIAGNOSIS — I129 Hypertensive chronic kidney disease with stage 1 through stage 4 chronic kidney disease, or unspecified chronic kidney disease: Secondary | ICD-10-CM | POA: Diagnosis present

## 2020-08-01 DIAGNOSIS — Z8673 Personal history of transient ischemic attack (TIA), and cerebral infarction without residual deficits: Secondary | ICD-10-CM | POA: Diagnosis not present

## 2020-08-01 DIAGNOSIS — R4182 Altered mental status, unspecified: Secondary | ICD-10-CM | POA: Diagnosis present

## 2020-08-01 LAB — PROTIME-INR
INR: 1.3 — ABNORMAL HIGH (ref 0.8–1.2)
Prothrombin Time: 15.3 seconds — ABNORMAL HIGH (ref 11.4–15.2)

## 2020-08-01 LAB — PROCALCITONIN: Procalcitonin: 0.12 ng/mL

## 2020-08-01 NOTE — Progress Notes (Signed)
PROGRESS NOTE    Andrea Barker  DPO:[id number] DOB: [birth year] DOA: 07/31/2020 PCP: Almetta Lovely, [facility]    Brief Narrative:  This 80 years old female with PMH significant for chronic bilateral leg skin changes, hypertension, chronic back pain, depression, bedridden since 2015 presents in the ED with altered mental status and hypoxia.  She was sent from [facility] where she was found to have poor response and decreased oxygen saturation.  Patient is not on oxygen at baseline.  She is found to have suprapubic tenderness on examination.  CT head and MRI : suspicious for  normal pressure hydrocephalus.  Found to have UTI and started on ceftriaxone.  Patient's mental status has improved. Neurology is consulted for NPH.  Assessment & Plan:   Active Problems:   Altered mental status   Failure to thrive in adult   Essential hypertension   CKD (chronic kidney disease), stage III (HCC)   Debility   Acute metabolic encephalopathy: >>  Improved. Suspect this could be secondary to UTI.  Other differentials include polypharmacy. Patient is not septic as lactic acid is negative. She has elevated heart rate, respiratory rate and leukocytosis that could be due to UTI Chest x-ray negative for pneumonia, baseline pro-Cal was negative. CT of the head without contrast was read as no evidence for acute intracranial abnormality Continue ceftriaxone 1 g every 24 hours. Follow-up urine cultures. Status post 1.5 L LR bolus given in the ED. MRI brain finding suspicious for NPH. Neurology consulted, awaiting recommendation.  Polypharmacy : Patient takes multiple sedating medications. Holding home gabapentin 300 mg nightly, oxycodone 10 mg every 6 hours as needed for pain, fentanyl patch every 3 days. Resume as she is more awake and alert.  Hypertension Continue amlodipine 10 mg and hydrochlorothiazide 12.5 mg daily .  Chronic bilateral lower extremity skin changes:  Per daughter at  bedside this is baseline for patient No indications for antibiotics at this time as this is not cellulitis.  Prominent lateral and third ventricles: CT and MRI suspicious for NPH. Patient seems back to her baseline mental status. Neurology consulted Dr. Derry Lory, awaiting recommendation.  Acute hypoxic respiratory failure >>> resolved. Patient was briefly hypoxic requiring 2 to 4 L of oxygen. She is on room air now hypoxia has been resolved.  Advanced dementia: Patient is alert and oriented x1. No behavioral problems.  Depression: Continue Zoloft  GERD: Continue pantoprazole  CKD stage IIIb:  creatinine to baseline   DVT prophylaxis:  Lovenox Code Status: DNR Family Communication: No family at bed side. Disposition Plan:   Status is: Observation  The patient remains OBS appropriate and will d/c before 2 midnights.  Dispo: The patient is from: SNF              Anticipated d/c is to: SNF              Patient currently is not medically stable to d/c.   Difficult to place patient No    Consultants:   Neurology  Procedures: CT head, MRI Antimicrobials: Anti-infectives (From admission, onward)   Start     Dose/Rate Route Frequency Ordered Stop   08/01/20 1200  cefTRIAXone (ROCEPHIN) 1 g in sodium chloride 0.9 % 100 mL IVPB        1 g 200 mL/hr over 30 Minutes Intravenous Every 24 hours 07/31/20 1826     07/31/20 2200  linezolid (ZYVOX) IVPB 600 mg  Status:  Discontinued        600 mg 300  mL/hr over 60 Minutes Intravenous Every 12 hours 07/31/20 1634 07/31/20 1820   07/31/20 1715  cefTRIAXone (ROCEPHIN) 1 g in sodium chloride 0.9 % 100 mL IVPB        1 g 200 mL/hr over 30 Minutes Intravenous  Once 07/31/20 1711 07/31/20 1747      Subjective: Patient was seen and examined at bedside.  Overnight events noted.   Patient seems back to her baseline mental status.   She has UTI and getting antibiotic denies any abdominal pain or burning.  Objective: Vitals:    08/01/20 0558 08/01/20 0733 08/01/20 1215 08/01/20 1550  BP:  139/69 112/63 127/61  Pulse:  90 88 86  Resp:  15 14 16   Temp:  98.5 F (36.9 C) 98 F (36.7 C) 97.8 F (36.6 C)  TempSrc:  Oral  Oral  SpO2:  94% 90% 92%  Weight: 59.6 kg       Intake/Output Summary (Last 24 hours) at 08/01/2020 1553 Last data filed at 08/01/2020 1549 Gross per 24 hour  Intake --  Output 200 ml  Net -200 ml   Filed Weights   07/31/20 1452 07/31/20 2336 08/01/20 0558  Weight: 57.6 kg 57.6 kg 59.6 kg    Examination:  General exam: Appears calm and comfortable, not in any acute distress. Respiratory system: Clear to auscultation. Respiratory effort normal. Cardiovascular system: S1 & S2 heard, RRR. No JVD, murmurs, rubs, gallops or clicks. No pedal edema. Gastrointestinal system: Abdomen is nondistended, soft and nontender. No organomegaly or masses felt. Normal bowel sounds heard. Central nervous system: Alert and oriented. No focal neurological deficits. Extremities: No edema, no cyanosis, chronic skin changes. Skin: No rashes, lesions or ulcers Psychiatry: Judgement and insight appear normal. Mood & affect appropriate.     Data Reviewed: I have personally reviewed following labs and imaging studies  CBC: Recent Labs  Lab 07/31/20 1458  WBC 17.6*  NEUTROABS 13.8*  HGB 13.5  HCT 41.3  MCV 89.4  PLT 508*   Basic Metabolic Panel: Recent Labs  Lab 07/31/20 1458  NA 140  K 4.3  CL 106  CO2 24  GLUCOSE 141*  BUN 46*  CREATININE 1.40*  CALCIUM 8.9   GFR: CrCl cannot be calculated (Unknown ideal weight.). Liver Function Tests: Recent Labs  Lab 07/31/20 1458  AST 14*  ALT 9  ALKPHOS 68  BILITOT 0.8  PROT 8.2*  ALBUMIN 3.2*   No results for input(s): LIPASE, AMYLASE in the last 168 hours. No results for input(s): AMMONIA in the last 168 hours. Coagulation Profile: Recent Labs  Lab 07/31/20 1855 08/01/20 0429  INR 1.2 1.3*   Cardiac Enzymes: No results for  input(s): CKTOTAL, CKMB, CKMBINDEX, TROPONINI in the last 168 hours. BNP (last 3 results) No results for input(s): PROBNP in the last 8760 hours. HbA1C: No results for input(s): HGBA1C in the last 72 hours. CBG: No results for input(s): GLUCAP in the last 168 hours. Lipid Profile: No results for input(s): CHOL, HDL, LDLCALC, TRIG, CHOLHDL, LDLDIRECT in the last 72 hours. Thyroid Function Tests: No results for input(s): TSH, T4TOTAL, FREET4, T3FREE, THYROIDAB in the last 72 hours. Anemia Panel: No results for input(s): VITAMINB12, FOLATE, FERRITIN, TIBC, IRON, RETICCTPCT in the last 72 hours. Sepsis Labs: Recent Labs  Lab 07/31/20 1458 08/01/20 0429  PROCALCITON 0.15 0.12  LATICACIDVEN 0.9  --     Recent Results (from the past 240 hour(s))  Resp Panel by RT-PCR (Flu A&B, Covid) Nasopharyngeal Swab  Status: None   Collection Time: 07/31/20  4:00 PM   Specimen: Nasopharyngeal Swab; Nasopharyngeal(NP) swabs in vial transport medium  Result Value Ref Range Status   SARS Coronavirus 2 by RT PCR NEGATIVE NEGATIVE Final    Comment: (NOTE) SARS-CoV-2 target nucleic acids are NOT DETECTED.  The SARS-CoV-2 RNA is generally detectable in upper respiratory specimens during the acute phase of infection. The lowest concentration of SARS-CoV-2 viral copies this assay can detect is 138 copies/mL. A negative result does not preclude SARS-Cov-2 infection and should not be used as the sole basis for treatment or other patient management decisions. A negative result may occur with  improper specimen collection/handling, submission of specimen other than nasopharyngeal swab, presence of viral mutation(s) within the areas targeted by this assay, and inadequate number of viral copies(<138 copies/mL). A negative result must be combined with clinical observations, patient history, and epidemiological information. The expected result is Negative.  Fact Sheet for Patients:   BloggerCourse.com  Fact Sheet for Healthcare Providers:  SeriousBroker.it  This test is no t yet approved or cleared by the [place] FDA and  has been authorized for detection and/or diagnosis of SARS-CoV-2 by FDA under an Emergency Use Authorization (EUA). This EUA will remain  in effect (meaning this test can be used) for the duration of the COVID-19 declaration under Section 564(b)(1) of the Act, 21 U.S.C.section 360bbb-3(b)(1), unless the authorization is terminated  or revoked sooner.       Influenza A by PCR NEGATIVE NEGATIVE Final   Influenza B by PCR NEGATIVE NEGATIVE Final    Comment: (NOTE) The Xpert Xpress SARS-CoV-2/FLU/RSV plus assay is intended as an aid in the diagnosis of influenza from Nasopharyngeal swab specimens and should not be used as a sole basis for treatment. Nasal washings and aspirates are unacceptable for Xpert Xpress SARS-CoV-2/FLU/RSV testing.  Fact Sheet for Patients: BloggerCourse.com  Fact Sheet for Healthcare Providers: SeriousBroker.it  This test is not yet approved or cleared by the [place] FDA and has been authorized for detection and/or diagnosis of SARS-CoV-2 by FDA under an Emergency Use Authorization (EUA). This EUA will remain in effect (meaning this test can be used) for the duration of the COVID-19 declaration under Section 564(b)(1) of the Act, 21 U.S.C. section 360bbb-3(b)(1), unless the authorization is terminated or revoked.  Performed at [facility], [address], [place], [place] [address]     Radiology Studies: DG Chest 2 View  Result Date: 07/31/2020 CLINICAL DATA:  Cough and chest congestion EXAM: CHEST - 2 VIEW COMPARISON:  12/20/2011 FINDINGS: Cardiac shadow is stable. Aortic calcifications are again seen. Elevation of the right hemidiaphragm is noted. Lungs are clear. No sizable effusion is  noted. IMPRESSION: No active cardiopulmonary disease. Electronically Signed   By: Alcide Clever M.D.   On: 07/31/2020 15:56   CT Head Wo Contrast  Result Date: 07/31/2020 CLINICAL DATA:  Mental status changes, unknown cause. Hypoxia. Lethargy. EXAM: CT HEAD WITHOUT CONTRAST TECHNIQUE: Contiguous axial images were obtained from the base of the skull through the vertex without intravenous contrast. COMPARISON:  None. FINDINGS: Brain: Moderate central and cortical atrophy. Prominent LATERAL and third ventricles. There is periventricular white matter change. There is no intra or extra-axial fluid collection or mass lesion. The basilar cisterns and ventricles have a normal appearance. There is no CT evidence for acute infarction or hemorrhage. Vascular: There is atherosclerotic calcification of the internal carotid arteries. Skull: Normal. Negative for fracture or focal lesion. Sinuses/Orbits: No acute finding.  Other: None. IMPRESSION: 1. No evidence for acute intracranial abnormality. 2. Atrophy and small vessel disease. 3. Prominent LATERAL and third ventricles may be related to central and cortical atrophy. However, normal pressure hydrocephalus is not excluded. Consider further evaluation with MRI. Electronically Signed   By: Norva Pavlov M.D.   On: 07/31/2020 16:34   MR BRAIN WO CONTRAST  Result Date: 07/31/2020 CLINICAL DATA:  Initial evaluation for normal pressure hydrocephalus. EXAM: MRI HEAD WITHOUT CONTRAST TECHNIQUE: Multiplanar, multiecho pulse sequences of the brain and surrounding structures were obtained without intravenous contrast. COMPARISON:  Prior CT from earlier the same day. FINDINGS: Brain: Diffuse prominence of the CSF containing spaces compatible with generalized cerebral atrophy. Patchy and confluent T2/FLAIR hyperintensity within the periventricular and deep white matter both cerebral hemispheres most likely related chronic microvascular ischemic disease, moderate in nature. Patchy  involvement of the pons noted. No convincing foci of diffusion abnormality to suggest acute or subacute ischemia are seen. Gray-white matter differentiation maintained. No encephalomalacia to suggest chronic cortical infarction. No foci of susceptibility artifact to suggest acute or chronic intracranial hemorrhage. Diffuse prominence of the lateral and third ventricles, with relatively normal caliber of the fourth ventricle is seen. This is somewhat out of proportion to overlying cortical sulcation. Evans index measures 0.4. Findings suggestive of NPH. A portion of the periventricular T2/FLAIR signal abnormality about the lateral ventricles may in part be related to a degree of mild transependymal flow of CSF. No mass lesion, mass effect, or midline shift. No extra-axial fluid collection. Pituitary gland suprasellar region normal. Midline structures intact. Vascular: Major intracranial vascular flow voids are maintained. Dominant right vertebral artery noted. Skull and upper cervical spine: Craniocervical junction normal. Bone marrow signal intensity within normal limits. No scalp soft tissue abnormality. Sinuses/Orbits: Patient status post bilateral ocular lens replacement. Globes and orbital soft tissues demonstrate no acute finding. Paranasal sinuses are largely clear. No mastoid effusion. Inner ear structures grossly normal. Other: None. IMPRESSION: 1. Diffuse prominence of the lateral and third ventricles, somewhat out of proportion to cortical sulcation, with relatively normal caliber of the fourth ventricle. 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 Clinical correlation recommended. 2. Underlying age-related cerebral atrophy with moderate chronic microvascular ischemic disease. 3. No other acute intracranial abnormality. Electronically Signed   By: Rise Mu M.D.   On: 07/31/2020 23:45    Scheduled Meds: . amLODipine  10 mg Oral Daily  . ascorbic acid  250 mg Oral Daily  . docusate sodium  200 mg  Oral Daily  . enoxaparin (LOVENOX) injection  30 mg Subcutaneous Q24H  . hydrochlorothiazide  12.5 mg Oral Daily  . loratadine  10 mg Oral Daily  . multivitamin with minerals  1 tablet Oral Daily  . pantoprazole  40 mg Oral Daily  . sertraline  50 mg Oral QHS   Continuous Infusions: . cefTRIAXone (ROCEPHIN)  IV 1 g (08/01/20 1231)     LOS: 0 days    Time spent: 35 mins    Linnie Mcglocklin, MD [facility]   If 7PM-7AM, please contact night-coverage

## 2020-08-02 DIAGNOSIS — R410 Disorientation, unspecified: Secondary | ICD-10-CM | POA: Diagnosis not present

## 2020-08-02 LAB — BASIC METABOLIC PANEL
Anion gap: 9 (ref 5–15)
BUN: 32 mg/dL — ABNORMAL HIGH (ref 8–23)
CO2: 25 mmol/L (ref 22–32)
Calcium: 8.8 mg/dL — ABNORMAL LOW (ref 8.9–10.3)
Chloride: 110 mmol/L (ref 98–111)
Creatinine, Ser: 1.09 mg/dL — ABNORMAL HIGH (ref 0.44–1.00)
GFR, Estimated: 52 mL/min — ABNORMAL LOW (ref >60)
Glucose, Bld: 105 mg/dL — ABNORMAL HIGH (ref 70–99)
Potassium: 3.7 mmol/L (ref 3.5–5.1)
Sodium: 144 mmol/L (ref 135–145)

## 2020-08-02 LAB — PHOSPHORUS: Phosphorus: 3.6 mg/dL (ref 2.5–4.6)

## 2020-08-02 LAB — FOLATE: Folate: 11.5 ng/mL (ref >5.9)

## 2020-08-02 LAB — MAGNESIUM: Magnesium: 2.3 mg/dL (ref 1.7–2.4)

## 2020-08-02 LAB — TSH: TSH: 0.965 u[IU]/mL (ref 0.350–4.500)

## 2020-08-02 LAB — VITAMIN B12: Vitamin B-12: 359 pg/mL (ref 180–914)

## 2020-08-02 MED ORDER — CEPHALEXIN 250 MG PO CAPS
250.0000 mg | ORAL_CAPSULE | Freq: Four times a day (QID) | ORAL | Status: DC
  Administered 2020-08-03 (×2): 250 mg via ORAL
  Filled 2020-08-02 (×4): qty 1

## 2020-08-02 MED ORDER — FENTANYL 100 MCG/HR TD PT72
1.0000 | MEDICATED_PATCH | TRANSDERMAL | Status: DC
Start: 2020-08-02 — End: 2020-08-03
  Administered 2020-08-02: 1 via TRANSDERMAL
  Filled 2020-08-02: qty 1

## 2020-08-02 NOTE — Progress Notes (Addendum)
PROGRESS NOTE    Andrea Barker  YHC:[id number] DOB: 1941-02-16 DOA: 07/31/2020 PCP: Almetta Lovely, [facility]    Brief Narrative:  This 80 years old female with PMH significant for chronic bilateral leg skin changes, hypertension, chronic back pain, depression, bedridden since 2015 presents in the ED with altered mental status and hypoxia.  She was sent from [facility] where she was found to have poor response and decreased oxygen saturation.  Patient is not on oxygen at baseline.  She is found to have suprapubic tenderness on examination.  CT head and MRI : suspicious for  normal pressure hydrocephalus.  Found to have UTI and started on ceftriaxone.  Patient's mental status has improved. Neurology is consulted for NPH.  Assessment & Plan:   Active Problems:   Altered mental status   Failure to thrive in adult   Essential hypertension   CKD (chronic kidney disease), stage III (HCC)   Debility   # Acute metabolic encephalopathy: >>  Improved. Suspect this could be secondary to UTI.  Other differentials include polypharmacy. Patient is not septic as lactic acid is negative. Chest x-ray negative for pneumonia, baseline pro-Cal was negative. CT of the head without contrast was read as no evidence for acute intracranial abnormality - cont abx as below  # Acute cystitis The likely cause of her AMS, now improving. Culture showing proteus - stop ceftriaxone, start keflex, f/u sensitivities  # Enlarged ventricles Neuro (khaliqdine) advising outpatient neuro f/u, also advising check b12, folate, tsh, which I will order  # Polypharmacy : Patient takes multiple sedating medications. Holding home gabapentin 300 mg nightly, oxycodone 10 mg every 6 hours as needed for pain, fentanyl patch every 3 days. - resume home fentanyl patch as mentation has improved and pt complaining of chronic pain - holding on home gabapentin and prn oxycodone  # Hypertension Here bp wnl - Continue  amlodipine 10 mg and hydrochlorothiazide 12.5 mg daily .  # Venous stasis dermatitis  Per daughter at bedside this is baseline for patient No indications for antibiotics at this time as this is not cellulitis.  # Acute hypoxic respiratory failure  Initially hypoxic. cxr clear, suspect this is chronic due to advanced age, shallow breathing, bedbound status. Required 2 L overnight - RN to wean as able  # Advanced dementia: Patient is alert and oriented to self and place. No behavioral problems.  # Depression: Continue Zoloft  GERD: Continue pantoprazole  CKD stage IIIb:  creatinine to baseline   DVT prophylaxis:  Lovenox Code Status: DNR Family Communication: No family at bed side. Disposition Plan: return to SNF  Status is: inpt  The patient will require care spanning > 2 midnights and should be moved to inpatient because: Inpatient level of care appropriate due to severity of illness  Dispo: The patient is from: SNF              Anticipated d/c is to: SNF              Patient currently is not medically stable to d/c.   Difficult to place patient No    Consultants:   Neurology  Procedures: CT head, MRI Antimicrobials: Anti-infectives (From admission, onward)   Start     Dose/Rate Route Frequency Ordered Stop   08/01/20 1200  cefTRIAXone (ROCEPHIN) 1 g in sodium chloride 0.9 % 100 mL IVPB        1 g 200 mL/hr over 30 Minutes Intravenous Every 24 hours 07/31/20 1826  07/31/20 2200  linezolid (ZYVOX) IVPB 600 mg  Status:  Discontinued        600 mg 300 mL/hr over 60 Minutes Intravenous Every 12 hours 07/31/20 1634 07/31/20 1820   07/31/20 1715  cefTRIAXone (ROCEPHIN) 1 g in sodium chloride 0.9 % 100 mL IVPB        1 g 200 mL/hr over 30 Minutes Intravenous  Once 07/31/20 1711 07/31/20 1747      Subjective: This morning oriented to self and place. Complaining of chronic pain, no new pain. No dysuria. No sob or chest pain. Has  appetite.  Objective: Vitals:   08/01/20 1550 08/01/20 1936 08/02/20 0330 08/02/20 0753  BP: 127/61 (!) 123/54 129/60 (!) 148/61  Pulse: 86 86 82 87  Resp: Temp: 97.8 F (36.6 C) 98.3 F (36.8 C) 98.3 F (36.8 C) 98.7 F (37.1 C)  TempSrc: Oral   Oral  SpO2: 92% 91% 92% 93%  Weight:   57.3 kg     Intake/Output Summary (Last 24 hours) at 08/02/2020 0904 Last data filed at 08/01/2020 1813 Gross per 24 hour  Intake --  Output 450 ml  Net -450 ml   Filed Weights   07/31/20 2336 08/01/20 0558 08/02/20 0330  Weight: 57.6 kg 59.6 kg 57.3 kg    Examination:  General exam: Appears calm and comfortable, not in any acute distress. Respiratory system: Clear to auscultation. Respiratory effort normal. Cardiovascular system: S1 & S2 heard, RRR. No JVD, murmurs, rubs, gallops or clicks. No pedal edema. Gastrointestinal system: Abdomen is nondistended, soft and nontender. No organomegaly or masses felt. Normal bowel sounds heard. Central nervous system: Alert and oriented. No focal neurological deficits. Extremities: No edema, no cyanosis. Ulcers anterior LEs Skin: see above Psychiatry: Judgement and insight appear normal. Mood & affect appropriate.     Data Reviewed: I have personally reviewed following labs and imaging studies  CBC: Recent Labs  Lab 07/31/20 1458  WBC 17.6*  NEUTROABS 13.8*  HGB 13.5  HCT 41.3  MCV 89.4  PLT 508*   Basic Metabolic Panel: Recent Labs  Lab 07/31/20 1458 08/02/20 0655  NA 140 144  K 4.3 3.7  CL 106 110  CO2 24 25  GLUCOSE 141* 105*  BUN 46* 32*  CREATININE 1.40* 1.09*  CALCIUM 8.9 8.8*  MG  --  2.3  PHOS  --  3.6   GFR: CrCl cannot be calculated (Unknown ideal weight.). Liver Function Tests: Recent Labs  Lab 07/31/20 1458  AST 14*  ALT 9  ALKPHOS 68  BILITOT 0.8  PROT 8.2*  ALBUMIN 3.2*   No results for input(s): LIPASE, AMYLASE in the last 168 hours. No results for input(s): AMMONIA in the last 168  hours. Coagulation Profile: Recent Labs  Lab 07/31/20 1855 08/01/20 0429  INR 1.2 1.3*   Cardiac Enzymes: No results for input(s): CKTOTAL, CKMB, CKMBINDEX, TROPONINI in the last 168 hours. BNP (last 3 results) No results for input(s): PROBNP in the last 8760 hours. HbA1C: No results for input(s): HGBA1C in the last 72 hours. CBG: No results for input(s): GLUCAP in the last 168 hours. Lipid Profile: No results for input(s): CHOL, HDL, LDLCALC, TRIG, CHOLHDL, LDLDIRECT in the last 72 hours. Thyroid Function Tests: No results for input(s): TSH, T4TOTAL, FREET4, T3FREE, THYROIDAB in the last 72 hours. Anemia Panel: No results for input(s): VITAMINB12, FOLATE, FERRITIN, TIBC, IRON, RETICCTPCT in the last 72 hours. Sepsis Labs: Recent Labs  Lab 07/31/20 1458 08/01/20 0429  PROCALCITON 0.15 0.12  LATICACIDVEN 0.9  --     Recent Results (from the past 240 hour(s))  Resp Panel by RT-PCR (Flu A&B, Covid) Nasopharyngeal Swab     Status: None   Collection Time: 07/31/20  4:00 PM   Specimen: Nasopharyngeal Swab; Nasopharyngeal(NP) swabs in vial transport medium  Result Value Ref Range Status   SARS Coronavirus 2 by RT PCR NEGATIVE NEGATIVE Final    Comment: (NOTE) SARS-CoV-2 target nucleic acids are NOT DETECTED.  The SARS-CoV-2 RNA is generally detectable in upper respiratory specimens during the acute phase of infection. The lowest concentration of SARS-CoV-2 viral copies this assay can detect is 138 copies/mL. A negative result does not preclude SARS-Cov-2 infection and should not be used as the sole basis for treatment or other patient management decisions. A negative result may occur with  improper specimen collection/handling, submission of specimen other than nasopharyngeal swab, presence of viral mutation(s) within the areas targeted by this assay, and inadequate number of viral copies(<138 copies/mL). A negative result must be combined with clinical observations,  patient history, and epidemiological information. The expected result is Negative.  Fact Sheet for Patients:  BloggerCourse.com  Fact Sheet for Healthcare Providers:  SeriousBroker.it  This test is no t yet approved or cleared by the [place] FDA and  has been authorized for detection and/or diagnosis of SARS-CoV-2 by FDA under an Emergency Use Authorization (EUA). This EUA will remain  in effect (meaning this test can be used) for the duration of the COVID-19 declaration under Section 564(b)(1) of the Act, 21 U.S.C.section 360bbb-3(b)(1), unless the authorization is terminated  or revoked sooner.       Influenza A by PCR NEGATIVE NEGATIVE Final   Influenza B by PCR NEGATIVE NEGATIVE Final    Comment: (NOTE) The Xpert Xpress SARS-CoV-2/FLU/RSV plus assay is intended as an aid in the diagnosis of influenza from Nasopharyngeal swab specimens and should not be used as a sole basis for treatment. Nasal washings and aspirates are unacceptable for Xpert Xpress SARS-CoV-2/FLU/RSV testing.  Fact Sheet for Patients: BloggerCourse.com  Fact Sheet for Healthcare Providers: SeriousBroker.it  This test is not yet approved or cleared by the [place] FDA and has been authorized for detection and/or diagnosis of SARS-CoV-2 by FDA under an Emergency Use Authorization (EUA). This EUA will remain in effect (meaning this test can be used) for the duration of the COVID-19 declaration under Section 564(b)(1) of the Act, 21 U.S.C. section 360bbb-3(b)(1), unless the authorization is terminated or revoked.  Performed at [facility], [address] [place], [place] [address]   Urine culture     Status: Abnormal (Preliminary result)   Collection Time: 07/31/20  4:00 PM   Specimen: In/Out Cath Urine  Result Value Ref Range Status   Specimen Description   Final    IN/OUT CATH  URINE Performed at [facility], [address], [place], [place] [address]    Special Requests   Final    NONE Performed at [facility], [address] [place], [place] [address]    Culture (A)  Final    >=100,000 COLONIES/mL Romie Minus NEGATIVE RODS SUSCEPTIBILITIES TO FOLLOW Performed at [facility], [address], [place], [place] [address]    Report Status PENDING  Incomplete    Radiology Studies: DG Chest 2 View  Result Date: 07/31/2020 CLINICAL DATA:  Cough and chest congestion EXAM: CHEST - 2 VIEW COMPARISON:  12/20/2011 FINDINGS: Cardiac shadow is stable. Aortic calcifications are  again seen. Elevation of the right hemidiaphragm is noted. Lungs are clear. No sizable effusion is noted. IMPRESSION: No active cardiopulmonary disease. Electronically Signed   By: Alcide Clever M.D.   On: 07/31/2020 15:56   CT Head Wo Contrast  Result Date: 07/31/2020 CLINICAL DATA:  Mental status changes, unknown cause. Hypoxia. Lethargy. EXAM: CT HEAD WITHOUT CONTRAST TECHNIQUE: Contiguous axial images were obtained from the base of the skull through the vertex without intravenous contrast. COMPARISON:  None. FINDINGS: Brain: Moderate central and cortical atrophy. Prominent LATERAL and third ventricles. There is periventricular white matter change. There is no intra or extra-axial fluid collection or mass lesion. The basilar cisterns and ventricles have a normal appearance. There is no CT evidence for acute infarction or hemorrhage. Vascular: There is atherosclerotic calcification of the internal carotid arteries. Skull: Normal. Negative for fracture or focal lesion. Sinuses/Orbits: No acute finding. Other: None. IMPRESSION: 1. No evidence for acute intracranial abnormality. 2. Atrophy and small vessel disease. 3. Prominent LATERAL and third ventricles may be related to central and cortical atrophy. However, normal pressure hydrocephalus is not excluded. Consider further evaluation  with MRI. Electronically Signed   By: Norva Pavlov M.D.   On: 07/31/2020 16:34   MR BRAIN WO CONTRAST  Result Date: 07/31/2020 CLINICAL DATA:  Initial evaluation for normal pressure hydrocephalus. EXAM: MRI HEAD WITHOUT CONTRAST TECHNIQUE: Multiplanar, multiecho pulse sequences of the brain and surrounding structures were obtained without intravenous contrast. COMPARISON:  Prior CT from earlier the same day. FINDINGS: Brain: Diffuse prominence of the CSF containing spaces compatible with generalized cerebral atrophy. Patchy and confluent T2/FLAIR hyperintensity within the periventricular and deep white matter both cerebral hemispheres most likely related chronic microvascular ischemic disease, moderate in nature. Patchy involvement of the pons noted. No convincing foci of diffusion abnormality to suggest acute or subacute ischemia are seen. Gray-white matter differentiation maintained. No encephalomalacia to suggest chronic cortical infarction. No foci of susceptibility artifact to suggest acute or chronic intracranial hemorrhage. Diffuse prominence of the lateral and third ventricles, with relatively normal caliber of the fourth ventricle is seen. This is somewhat out of proportion to overlying cortical sulcation. Evans index measures 0.4. Findings suggestive of NPH. A portion of the periventricular T2/FLAIR signal abnormality about the lateral ventricles may in part be related to a degree of mild transependymal flow of CSF. No mass lesion, mass effect, or midline shift. No extra-axial fluid collection. Pituitary gland suprasellar region normal. Midline structures intact. Vascular: Major intracranial vascular flow voids are maintained. Dominant right vertebral artery noted. Skull and upper cervical spine: Craniocervical junction normal. Bone marrow signal intensity within normal limits. No scalp soft tissue abnormality. Sinuses/Orbits: Patient status post bilateral ocular lens replacement. Globes and  orbital soft tissues demonstrate no acute finding. Paranasal sinuses are largely clear. No mastoid effusion. Inner ear structures grossly normal. Other: None. IMPRESSION: 1. Diffuse prominence of the lateral and third ventricles, somewhat out of proportion to cortical sulcation, with relatively normal caliber of the fourth ventricle. 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 Clinical correlation recommended. 2. Underlying age-related cerebral atrophy with moderate chronic microvascular ischemic disease. 3. No other acute intracranial abnormality. Electronically Signed   By: Rise Mu M.D.   On: 07/31/2020 23:45    Scheduled Meds: . amLODipine  10 mg Oral Daily  . ascorbic acid  250 mg Oral Daily  . docusate sodium  200 mg Oral Daily  . enoxaparin (LOVENOX) injection  30 mg Subcutaneous Q24H  . fentaNYL  1 patch Transdermal Q72H  .  hydrochlorothiazide  12.5 mg Oral Daily  . loratadine  10 mg Oral Daily  . multivitamin with minerals  1 tablet Oral Daily  . pantoprazole  40 mg Oral Daily  . sertraline  50 mg Oral QHS   Continuous Infusions: . cefTRIAXone (ROCEPHIN)  IV 1 g (08/01/20 1231)     LOS: 1 day    Time spent: 35 mins    Silvano Bilis, MD [facility]   If 7PM-7AM, please contact night-coverage

## 2020-08-02 NOTE — Plan of Care (Signed)
  Problem: Clinical Measurements: Goal: Ability to maintain clinical measurements within normal limits will improve Outcome: Progressing   Problem: Clinical Measurements: Goal: Diagnostic test results will improve Outcome: Progressing   Problem: Clinical Measurements: Goal: Will remain free from infection Outcome: Progressing   Problem: Safety: Goal: Ability to remain free from injury will improve Outcome: Progressing   Problem: Skin Integrity: Goal: Risk for impaired skin integrity will decrease Outcome: Progressing

## 2020-08-03 DIAGNOSIS — R410 Disorientation, unspecified: Secondary | ICD-10-CM | POA: Diagnosis not present

## 2020-08-03 LAB — URINE CULTURE: Culture: >=100000 — AB

## 2020-08-03 LAB — SARS CORONAVIRUS 2 BY RT PCR (HOSPITAL ORDER, PERFORMED IN ~~LOC~~ HOSPITAL LAB): SARS Coronavirus 2: NEGATIVE

## 2020-08-03 MED ORDER — CEPHALEXIN 250 MG PO CAPS: 250.0000 mg | ORAL_CAPSULE | Freq: Four times a day (QID) | ORAL | 0 refills | Status: DC

## 2020-08-03 MED ORDER — OXYCODONE HCL 10 MG PO TABS: 5.0000 mg | ORAL_TABLET | Freq: Four times a day (QID) | ORAL | 0 refills | Status: DC | PRN

## 2020-08-03 NOTE — Plan of Care (Signed)
Pt dc back to the facility, report call to the receiving nurse, AVS printed and placed in the package. IV dc.  Problem: Education: Goal: Knowledge of General Education information will improve Description: Including pain rating scale, medication(s)/side effects and non-pharmacologic comfort measures Outcome: Adequate for Discharge   Problem: Health Behavior/Discharge Planning: Goal: Ability to manage health-related needs will improve Outcome: Adequate for Discharge   Problem: Clinical Measurements: Goal: Ability to maintain clinical measurements within normal limits will improve Outcome: Adequate for Discharge Goal: Will remain free from infection Outcome: Adequate for Discharge Goal: Diagnostic test results will improve Outcome: Adequate for Discharge Goal: Respiratory complications will improve Outcome: Adequate for Discharge Goal: Cardiovascular complication will be avoided Outcome: Adequate for Discharge   Problem: Activity: Goal: Risk for activity intolerance will decrease Outcome: Adequate for Discharge   Problem: Nutrition: Goal: Adequate nutrition will be maintained Outcome: Adequate for Discharge   Problem: Coping: Goal: Level of anxiety will decrease Outcome: Adequate for Discharge   Problem: Elimination: Goal: Will not experience complications related to bowel motility Outcome: Adequate for Discharge Goal: Will not experience complications related to urinary retention Outcome: Adequate for Discharge   Problem: Pain Managment: Goal: General experience of comfort will improve Outcome: Adequate for Discharge   Problem: Safety: Goal: Ability to remain free from injury will improve Outcome: Adequate for Discharge   Problem: Skin Integrity: Goal: Risk for impaired skin integrity will decrease Outcome: Adequate for Discharge

## 2020-08-03 NOTE — TOC Transition Note (Signed)
Transition of Care [facility]) - CM/SW Discharge Note   Patient Details  Name: JACI DESANTO MRN: [id number] Date of Birth: 1941-05-15  [facility] CM/SW Contact:  Hetty Ely, RN Phone Number: 08/03/2020, 10:47 AM    Final next level of care: Skilled Nursing Facility Barriers to Discharge: Barriers Resolved   Patient Goals and CMS Choice Patient states their goals for this hospitalization and ongoing recovery are:: To return home.   Choice offered to / list presented to : NA  Discharge Placement                Patient to be transferred to facility by: [facility] Name of family member notified: Theodis Shove Patient and family notified of of transfer: 08/03/20  Discharge Plan and Services                DME Arranged: N/A DME Agency: NA       HH Arranged: NA HH Agency: NA        Social Determinants of Health (SDOH) Interventions     Readmission Risk Interventions No flowsheet data found.

## 2020-08-03 NOTE — Care Management Important Message (Signed)
Important Message  Patient Details  Name: Andrea Barker MRN: [id number] Date of Birth: [birth year]   Medicare Important Message Given:  N/A - LOS <3 / Initial given by admissions  Initial Medicare IM reviewed with Theodis Shove, daughter-in-law, by Jennye Moccasin, Patient Access Associate on 08/02/2020 at 12:07pm.    Johnell Comings 08/03/2020, 12:33 PM

## 2020-08-03 NOTE — Discharge Summary (Signed)
Andrea Barker:[id number] DOB: 03-20-1941 DOA: 07/31/2020  PCP: [facility]  Admit date: 07/31/2020 Discharge date: 08/03/2020  Time spent: 35 minutes  Recommendations for Outpatient Follow-up:  1. Consider prophylactic antibiotic for recurrent UTI (daughter-in-law says they are frequent)  2. Discharging on O2, wean as able    Discharge Diagnoses:  Active Problems:   Altered mental status   Failure to thrive in adult   Essential hypertension   CKD (chronic kidney disease), stage III (HCC)   Debility   Discharge Condition: fair  Diet recommendation: regular  Filed Weights   08/01/20 0558 08/02/20 0330 08/03/20 0500  Weight: 59.6 kg 57.3 kg 58.2 kg    History of present illness:  Andrea Barker is a 80 y.o. female with medical history significant for bedridden, since 2015, chronic bilateral leg skin changes at baseline, hypertension, chronic back pain, depression, presented to [facility] for chief concern of altered mentation this AM and hypoxia.   [facility] [employer] did not report to daughter how low the O2 dropped. Per daughter-in-law at bedside, patient is not on baseline O2 supplementation.  At bedside, her O2 was 88-92%.  On physical exam, she had suprapubic tenderness.  Social history: Patient is divorced and patient only has 1 son.  Per son and daughter-in-law, patient has DNR order and they will bring it in the a.m.  I confirm DNR status with son and daughter-in-law  ROS: unable to complete as patient has moderate to severe dementia  ED Course: Discussed with ED provider, patient requiring hospitalization due to metabolic encephalopathy.  Vitals in the emergency department with 98.5, respiration rate of 18, heart rate of 97, blood pressure 112/55, SPO2 of 89% on 4 L nasal cannula.  Labs also remarkable for serum creatinine of 1.40, WBC of 17.6, hemoglobin of 13.5, hematocrit of 41, platelets of 508.  UA was positive for leukocytes.  CT  of the head without contrast by ED provider was read as prominent lateral and third ventricles may be related to central and cortical atrophy.  However normal pressure hydrocephalus is not excluded.  Read recommended MRI for further evaluation  Hospital Course:  # Acute metabolic encephalopathy Resolved, likely 2/2 acute cystitis, polypharmacy playing a role. CT of the head without contrast was read as no evidence for acute intracranial abnormality  # Acute cystitis The likely cause of her AMS, now improving. Culture showing proteus - rx for keflex to finish 7 days abx  # Enlarged ventricles Neuro (khaliqdine) advising outpatient neuro f/u, also advising check b12, folate, tsh, which were normal. Given sig improvement in symptoms with treating infection think unlikely normal pressure hydrocephalus, but can consider outpt neurology f/u  # Polypharmacy : Patient takes multiple sedating medications. Advise work to taper home meds.   # Hypertension controlled  # Venous stasis dermatitis  Stable  # Acute hypoxic respiratory failure  Initially hypoxic. cxr clear, suspect this is chronic due to advanced age, shallow breathing, bedbound status. - O2 at discharge, wean as able  # Advanced dementia: Patient is alert and oriented to self and place. No behavioral problems.  # Depression: Continued Zoloft  Procedures:  none   Consultations:  none  Discharge Exam: Vitals:   08/03/20 0331 08/03/20 0741  BP: 131/70 127/69  Pulse: 78 83  Resp: 16 18  Temp: 98.3 F (36.8 C) 98.4 F (36.9 C)  SpO2: 93% (!) 89%    General exam: Appears calm and comfortable, not in any acute distress. Respiratory system: Clear to  auscultation. Respiratory effort normal. Cardiovascular system: S1 & S2 heard, RRR. No JVD, murmurs, rubs, gallops or clicks. No pedal edema. Gastrointestinal system: Abdomen is nondistended, soft and nontender. No organomegaly or masses felt. Normal bowel sounds  heard. Central nervous system: Alert and oriented. No focal neurological deficits. Extremities: No edema, no cyanosis. Ulcers anterior LEs Skin: see above Psychiatry: Judgement and insight appear normal. Mood & affect appropriate.  Discharge Instructions   Discharge Instructions    Diet general   Complete by: As directed    Increase activity slowly   Complete by: As directed      Allergies as of 08/03/2020      Reactions   Avelox [moxifloxacin Hcl In Nacl] Swelling   Calamine    RASH   Clindamycin Diarrhea, Nausea Only   Clindamycin/lincomycin Nausea And Vomiting   Doxepin    Other reaction(s): Diarrhea   Fluorouracil    Other reaction(s): Rash   Indomethacin Nausea And Vomiting   Levaquin [levofloxacin] Diarrhea   Minocycline Diarrhea, Nausea Only   Penicillins Swelling   Sulfa Antibiotics Hives   Vancomycin Swelling   Latex Rash   Neomycin-bacitracin Zn-polymyx Rash      Medication List    TAKE these medications   acetaminophen 650 MG CR tablet Commonly known as: TYLENOL Take 1,300 mg by mouth every 8 (eight) hours as needed for pain.   amLODipine 10 MG tablet Commonly known as: NORVASC Take 10 mg by mouth daily.   cephALEXin 250 MG capsule Commonly known as: KEFLEX Take 1 capsule (250 mg total) by mouth 4 (four) times daily.   cetirizine 10 MG tablet Commonly known as: ZYRTEC Take 10 mg by mouth daily.   diclofenac Sodium 1 % Gel Commonly known as: VOLTAREN Apply 2 g topically in the morning, at noon, in the evening, and at bedtime.   docusate sodium 100 MG capsule Commonly known as: COLACE Take 200 mg by mouth daily.   esomeprazole 20 MG capsule Commonly known as: NEXIUM Take 20 mg by mouth daily at 12 noon.   fentaNYL 100 MCG/HR Commonly known as: DURAGESIC Place 1 patch onto the skin every 3 (three) days.   fluticasone 50 MCG/ACT nasal spray Commonly known as: FLONASE Place into both nostrils daily.   gabapentin 300 MG capsule Commonly  known as: NEURONTIN Take 300 mg by mouth at bedtime.   hydrochlorothiazide 12.5 MG tablet Commonly known as: HYDRODIURIL Take 12.5 mg by mouth daily.   lidocaine 5 % Commonly known as: LIDODERM Place 1 patch onto the skin daily. Remove & Discard patch within 12 hours or as directed by MD   losartan 100 MG tablet Commonly known as: COZAAR Take 100 mg by mouth daily.   magnesium oxide 400 MG tablet Commonly known as: MAG-OX Take 400 mg by mouth 2 (two) times daily.   multivitamin with minerals tablet Take 1 tablet by mouth daily.   nystatin cream Commonly known as: MYCOSTATIN Apply 1 application topically 2 (two) times daily.   Oxycodone HCl 10 MG Tabs Take 0.5 tablets (5 mg total) by mouth every 6 (six) hours as needed. What changed:   how much to take  reasons to take this   polyethylene glycol 17 g packet Commonly known as: MIRALAX / GLYCOLAX Take 17 g by mouth daily as needed for mild constipation.   promethazine 25 MG tablet Commonly known as: PHENERGAN Take 25 mg by mouth every 6 (six) hours as needed for nausea or vomiting.   sertraline 50  MG tablet Commonly known as: ZOLOFT Take 50 mg by mouth daily.   vitamin C 100 MG tablet Take 100 mg by mouth daily.      Allergies  Allergen Reactions  . Avelox [Moxifloxacin Hcl In Nacl] Swelling  . Calamine     RASH  . Clindamycin Diarrhea and Nausea Only  . Clindamycin/Lincomycin Nausea And Vomiting  . Doxepin     Other reaction(s): Diarrhea  . Fluorouracil     Other reaction(s): Rash  . Indomethacin Nausea And Vomiting  . Levaquin [Levofloxacin] Diarrhea  . Minocycline Diarrhea and Nausea Only  . Penicillins Swelling  . Sulfa Antibiotics Hives  . Vancomycin Swelling  . Latex Rash  . Neomycin-Bacitracin Zn-Polymyx Rash    Follow-up Information    [facility] Follow up.   Specialty: Geriatric Medicine Contact information: [address] [place]  [address] [phone]                The results of significant diagnostics from this hospitalization (including imaging, microbiology, ancillary and laboratory) are listed below for reference.    Significant Diagnostic Studies: DG Chest 2 View  Result Date: 07/31/2020 CLINICAL DATA:  Cough and chest congestion EXAM: CHEST - 2 VIEW COMPARISON:  12/20/2011 FINDINGS: Cardiac shadow is stable. Aortic calcifications are again seen. Elevation of the right hemidiaphragm is noted. Lungs are clear. No sizable effusion is noted. IMPRESSION: No active cardiopulmonary disease. Electronically Signed   By: Alcide Clever M.D.   On: 07/31/2020 15:56   CT Head Wo Contrast  Result Date: 07/31/2020 CLINICAL DATA:  Mental status changes, unknown cause. Hypoxia. Lethargy. EXAM: CT HEAD WITHOUT CONTRAST TECHNIQUE: Contiguous axial images were obtained from the base of the skull through the vertex without intravenous contrast. COMPARISON:  None. FINDINGS: Brain: Moderate central and cortical atrophy. Prominent LATERAL and third ventricles. There is periventricular white matter change. There is no intra or extra-axial fluid collection or mass lesion. The basilar cisterns and ventricles have a normal appearance. There is no CT evidence for acute infarction or hemorrhage. Vascular: There is atherosclerotic calcification of the internal carotid arteries. Skull: Normal. Negative for fracture or focal lesion. Sinuses/Orbits: No acute finding. Other: None. IMPRESSION: 1. No evidence for acute intracranial abnormality. 2. Atrophy and small vessel disease. 3. Prominent LATERAL and third ventricles may be related to central and cortical atrophy. However, normal pressure hydrocephalus is not excluded. Consider further evaluation with MRI. Electronically Signed   By: Norva Pavlov M.D.   On: 07/31/2020 16:34   MR BRAIN WO CONTRAST  Result Date: 07/31/2020 CLINICAL DATA:  Initial evaluation for normal pressure hydrocephalus.  EXAM: MRI HEAD WITHOUT CONTRAST TECHNIQUE: Multiplanar, multiecho pulse sequences of the brain and surrounding structures were obtained without intravenous contrast. COMPARISON:  Prior CT from earlier the same day. FINDINGS: Brain: Diffuse prominence of the CSF containing spaces compatible with generalized cerebral atrophy. Patchy and confluent T2/FLAIR hyperintensity within the periventricular and deep white matter both cerebral hemispheres most likely related chronic microvascular ischemic disease, moderate in nature. Patchy involvement of the pons noted. No convincing foci of diffusion abnormality to suggest acute or subacute ischemia are seen. Gray-white matter differentiation maintained. No encephalomalacia to suggest chronic cortical infarction. No foci of susceptibility artifact to suggest acute or chronic intracranial hemorrhage. Diffuse prominence of the lateral and third ventricles, with relatively normal caliber of the fourth ventricle is seen. This is somewhat out of proportion to overlying cortical sulcation. Evans index measures 0.4. Findings suggestive of  NPH. A portion of the periventricular T2/FLAIR signal abnormality about the lateral ventricles may in part be related to a degree of mild transependymal flow of CSF. No mass lesion, mass effect, or midline shift. No extra-axial fluid collection. Pituitary gland suprasellar region normal. Midline structures intact. Vascular: Major intracranial vascular flow voids are maintained. Dominant right vertebral artery noted. Skull and upper cervical spine: Craniocervical junction normal. Bone marrow signal intensity within normal limits. No scalp soft tissue abnormality. Sinuses/Orbits: Patient status post bilateral ocular lens replacement. Globes and orbital soft tissues demonstrate no acute finding. Paranasal sinuses are largely clear. No mastoid effusion. Inner ear structures grossly normal. Other: None. IMPRESSION: 1. Diffuse prominence of the lateral and  third ventricles, somewhat out of proportion to cortical sulcation, with relatively normal caliber of the fourth ventricle. 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 Clinical correlation recommended. 2. Underlying age-related cerebral atrophy with moderate chronic microvascular ischemic disease. 3. No other acute intracranial abnormality. Electronically Signed   By: Rise Mu M.D.   On: 07/31/2020 23:45    Microbiology: Recent Results (from the past 240 hour(s))  Resp Panel by RT-PCR (Flu A&B, Covid) Nasopharyngeal Swab     Status: None   Collection Time: 07/31/20  4:00 PM   Specimen: Nasopharyngeal Swab; Nasopharyngeal(NP) swabs in vial transport medium  Result Value Ref Range Status   SARS Coronavirus 2 by RT PCR NEGATIVE NEGATIVE Final    Comment: (NOTE) SARS-CoV-2 target nucleic acids are NOT DETECTED.  The SARS-CoV-2 RNA is generally detectable in upper respiratory specimens during the acute phase of infection. The lowest concentration of SARS-CoV-2 viral copies this assay can detect is 138 copies/mL. A negative result does not preclude SARS-Cov-2 infection and should not be used as the sole basis for treatment or other patient management decisions. A negative result may occur with  improper specimen collection/handling, submission of specimen other than nasopharyngeal swab, presence of viral mutation(s) within the areas targeted by this assay, and inadequate number of viral copies(<138 copies/mL). A negative result must be combined with clinical observations, patient history, and epidemiological information. The expected result is Negative.  Fact Sheet for Patients:  BloggerCourse.com  Fact Sheet for Healthcare Providers:  SeriousBroker.it  This test is no t yet approved or cleared by the [place] FDA and  has been authorized for detection and/or diagnosis of SARS-CoV-2 by FDA under an Emergency Use Authorization  (EUA). This EUA will remain  in effect (meaning this test can be used) for the duration of the COVID-19 declaration under Section 564(b)(1) of the Act, 21 U.S.C.section 360bbb-3(b)(1), unless the authorization is terminated  or revoked sooner.       Influenza A by PCR NEGATIVE NEGATIVE Final   Influenza B by PCR NEGATIVE NEGATIVE Final    Comment: (NOTE) The Xpert Xpress SARS-CoV-2/FLU/RSV plus assay is intended as an aid in the diagnosis of influenza from Nasopharyngeal swab specimens and should not be used as a sole basis for treatment. Nasal washings and aspirates are unacceptable for Xpert Xpress SARS-CoV-2/FLU/RSV testing.  Fact Sheet for Patients: BloggerCourse.com  Fact Sheet for Healthcare Providers: SeriousBroker.it  This test is not yet approved or cleared by the [place] FDA and has been authorized for detection and/or diagnosis of SARS-CoV-2 by FDA under an Emergency Use Authorization (EUA). This EUA will remain in effect (meaning this test can be used) for the duration of the COVID-19 declaration under Section 564(b)(1) of the Act, 21 U.S.C. section 360bbb-3(b)(1), unless the authorization is terminated or revoked.  Performed at [facility], [address] [place], [place] [address]   Urine culture     Status: Abnormal   Collection Time: 07/31/20  4:00 PM   Specimen: In/Out Cath Urine  Result Value Ref Range Status   Specimen Description   Final    IN/OUT CATH URINE Performed at [facility], [address] [place], [place] [address]    Special Requests   Final    NONE Performed at [facility], [address] [place], [place] [address]    Culture >=100,000 COLONIES/mL PROTEUS MIRABILIS (A)  Final   Report Status 08/03/2020 FINAL  Final   Organism ID, Bacteria PROTEUS MIRABILIS (A)  Final      Susceptibility   Proteus mirabilis - MIC*    AMPICILLIN <=2 SENSITIVE  Sensitive     CEFAZOLIN <=4 SENSITIVE Sensitive     CEFEPIME <=0.12 SENSITIVE Sensitive     CEFTRIAXONE <=0.25 SENSITIVE Sensitive     CIPROFLOXACIN >=4 RESISTANT Resistant     GENTAMICIN <=1 SENSITIVE Sensitive     IMIPENEM 2 SENSITIVE Sensitive     NITROFURANTOIN 128 RESISTANT Resistant     TRIMETH/SULFA 40 SENSITIVE Sensitive     AMPICILLIN/SULBACTAM <=2 SENSITIVE Sensitive     PIP/TAZO <=4 SENSITIVE Sensitive     * >=100,000 COLONIES/mL PROTEUS MIRABILIS     Labs: Basic Metabolic Panel: Recent Labs  Lab 07/31/20 1458 08/02/20 0655  NA 140 144  K 4.3 3.7  CL 106 110  CO2 24 25  GLUCOSE 141* 105*  BUN 46* 32*  CREATININE 1.40* 1.09*  CALCIUM 8.9 8.8*  MG  --  2.3  PHOS  --  3.6   Liver Function Tests: Recent Labs  Lab 07/31/20 1458  AST 14*  ALT 9  ALKPHOS 68  BILITOT 0.8  PROT 8.2*  ALBUMIN 3.2*   No results for input(s): LIPASE, AMYLASE in the last 168 hours. No results for input(s): AMMONIA in the last 168 hours. CBC: Recent Labs  Lab 07/31/20 1458  WBC 17.6*  NEUTROABS 13.8*  HGB 13.5  HCT 41.3  MCV 89.4  PLT 508*   Cardiac Enzymes: No results for input(s): CKTOTAL, CKMB, CKMBINDEX, TROPONINI in the last 168 hours. BNP: BNP (last 3 results) No results for input(s): BNP in the last 8760 hours.  ProBNP (last 3 results) No results for input(s): PROBNP in the last 8760 hours.  CBG: No results for input(s): GLUCAP in the last 168 hours.     Signed:  Silvano Bilis MD.  [facility] 08/03/2020, 11:06 AM

## 2020-08-08 ENCOUNTER — Emergency Department: Payer: Medicare Other

## 2020-08-08 ENCOUNTER — Inpatient Hospital Stay
Admission: EM | Admit: 2020-08-08 | Discharge: 2020-08-14 | DRG: 871 | Disposition: A | Discharge status: Skilled Nursing Facility | Payer: Medicare Other | Source: Skilled Nursing Facility | Attending: Internal Medicine | Admitting: Internal Medicine

## 2020-08-08 ENCOUNTER — Other Ambulatory Visit: Payer: Self-pay

## 2020-08-08 DIAGNOSIS — Z90711 Acquired absence of uterus with remaining cervical stump: Secondary | ICD-10-CM

## 2020-08-08 DIAGNOSIS — K567 Ileus, unspecified: Secondary | ICD-10-CM

## 2020-08-08 DIAGNOSIS — G9349 Other encephalopathy: Secondary | ICD-10-CM | POA: Diagnosis present

## 2020-08-08 DIAGNOSIS — M21372 Foot drop, left foot: Secondary | ICD-10-CM | POA: Diagnosis present

## 2020-08-08 DIAGNOSIS — I10 Essential (primary) hypertension: Secondary | ICD-10-CM | POA: Diagnosis present

## 2020-08-08 DIAGNOSIS — F0390 Unspecified dementia without behavioral disturbance: Secondary | ICD-10-CM | POA: Diagnosis present

## 2020-08-08 DIAGNOSIS — K559 Vascular disorder of intestine, unspecified: Secondary | ICD-10-CM | POA: Diagnosis present

## 2020-08-08 DIAGNOSIS — Z22322 Carrier or suspected carrier of Methicillin resistant Staphylococcus aureus: Secondary | ICD-10-CM

## 2020-08-08 DIAGNOSIS — Z7189 Other specified counseling: Secondary | ICD-10-CM | POA: Diagnosis not present

## 2020-08-08 DIAGNOSIS — M21371 Foot drop, right foot: Secondary | ICD-10-CM | POA: Diagnosis present

## 2020-08-08 DIAGNOSIS — K219 Gastro-esophageal reflux disease without esophagitis: Secondary | ICD-10-CM | POA: Diagnosis present

## 2020-08-08 DIAGNOSIS — A419 Sepsis, unspecified organism: Secondary | ICD-10-CM | POA: Diagnosis not present

## 2020-08-08 DIAGNOSIS — E860 Dehydration: Secondary | ICD-10-CM | POA: Diagnosis not present

## 2020-08-08 DIAGNOSIS — N179 Acute kidney failure, unspecified: Secondary | ICD-10-CM | POA: Diagnosis present

## 2020-08-08 DIAGNOSIS — N3090 Cystitis, unspecified without hematuria: Secondary | ICD-10-CM | POA: Diagnosis present

## 2020-08-08 DIAGNOSIS — N1831 Chronic kidney disease, stage 3a: Secondary | ICD-10-CM | POA: Diagnosis present

## 2020-08-08 DIAGNOSIS — R5381 Other malaise: Secondary | ICD-10-CM | POA: Diagnosis present

## 2020-08-08 DIAGNOSIS — Z9981 Dependence on supplemental oxygen: Secondary | ICD-10-CM

## 2020-08-08 DIAGNOSIS — J189 Pneumonia, unspecified organism: Secondary | ICD-10-CM | POA: Diagnosis not present

## 2020-08-08 DIAGNOSIS — Z883 Allergy status to other anti-infective agents status: Secondary | ICD-10-CM

## 2020-08-08 DIAGNOSIS — Z888 Allergy status to other drugs, medicaments and biological substances status: Secondary | ICD-10-CM

## 2020-08-08 DIAGNOSIS — F419 Anxiety disorder, unspecified: Secondary | ICD-10-CM | POA: Diagnosis present

## 2020-08-08 DIAGNOSIS — Z79891 Long term (current) use of opiate analgesic: Secondary | ICD-10-CM

## 2020-08-08 DIAGNOSIS — I872 Venous insufficiency (chronic) (peripheral): Secondary | ICD-10-CM | POA: Diagnosis present

## 2020-08-08 DIAGNOSIS — Z881 Allergy status to other antibiotic agents status: Secondary | ICD-10-CM

## 2020-08-08 DIAGNOSIS — R627 Adult failure to thrive: Secondary | ICD-10-CM | POA: Diagnosis present

## 2020-08-08 DIAGNOSIS — Z66 Do not resuscitate: Secondary | ICD-10-CM | POA: Diagnosis present

## 2020-08-08 DIAGNOSIS — I313 Pericardial effusion (noninflammatory): Secondary | ICD-10-CM | POA: Diagnosis present

## 2020-08-08 DIAGNOSIS — Z515 Encounter for palliative care: Secondary | ICD-10-CM | POA: Diagnosis not present

## 2020-08-08 DIAGNOSIS — N183 Chronic kidney disease, stage 3 unspecified: Secondary | ICD-10-CM | POA: Diagnosis present

## 2020-08-08 DIAGNOSIS — M1990 Unspecified osteoarthritis, unspecified site: Secondary | ICD-10-CM | POA: Diagnosis present

## 2020-08-08 DIAGNOSIS — R4182 Altered mental status, unspecified: Secondary | ICD-10-CM | POA: Diagnosis present

## 2020-08-08 DIAGNOSIS — G8929 Other chronic pain: Secondary | ICD-10-CM | POA: Diagnosis present

## 2020-08-08 DIAGNOSIS — I774 Celiac artery compression syndrome: Secondary | ICD-10-CM | POA: Diagnosis present

## 2020-08-08 DIAGNOSIS — A414 Sepsis due to anaerobes: Secondary | ICD-10-CM | POA: Diagnosis present

## 2020-08-08 DIAGNOSIS — A0472 Enterocolitis due to Clostridium difficile, not specified as recurrent: Secondary | ICD-10-CM | POA: Diagnosis present

## 2020-08-08 DIAGNOSIS — R400 Somnolence: Secondary | ICD-10-CM | POA: Diagnosis not present

## 2020-08-08 DIAGNOSIS — J302 Other seasonal allergic rhinitis: Secondary | ICD-10-CM | POA: Diagnosis present

## 2020-08-08 DIAGNOSIS — I129 Hypertensive chronic kidney disease with stage 1 through stage 4 chronic kidney disease, or unspecified chronic kidney disease: Secondary | ICD-10-CM | POA: Diagnosis present

## 2020-08-08 DIAGNOSIS — R6520 Severe sepsis without septic shock: Secondary | ICD-10-CM | POA: Diagnosis present

## 2020-08-08 DIAGNOSIS — J9611 Chronic respiratory failure with hypoxia: Secondary | ICD-10-CM | POA: Diagnosis present

## 2020-08-08 DIAGNOSIS — R532 Functional quadriplegia: Secondary | ICD-10-CM | POA: Diagnosis present

## 2020-08-08 DIAGNOSIS — N170 Acute kidney failure with tubular necrosis: Secondary | ICD-10-CM | POA: Diagnosis not present

## 2020-08-08 DIAGNOSIS — Z7401 Bed confinement status: Secondary | ICD-10-CM

## 2020-08-08 DIAGNOSIS — G9340 Encephalopathy, unspecified: Secondary | ICD-10-CM | POA: Diagnosis not present

## 2020-08-08 DIAGNOSIS — E876 Hypokalemia: Secondary | ICD-10-CM | POA: Diagnosis not present

## 2020-08-08 DIAGNOSIS — Z87891 Personal history of nicotine dependence: Secondary | ICD-10-CM

## 2020-08-08 DIAGNOSIS — Z882 Allergy status to sulfonamides status: Secondary | ICD-10-CM

## 2020-08-08 DIAGNOSIS — B964 Proteus (mirabilis) (morganii) as the cause of diseases classified elsewhere: Secondary | ICD-10-CM | POA: Diagnosis present

## 2020-08-08 DIAGNOSIS — Z880 Allergy status to penicillin: Secondary | ICD-10-CM

## 2020-08-08 DIAGNOSIS — Z20822 Contact with and (suspected) exposure to covid-19: Secondary | ICD-10-CM | POA: Diagnosis present

## 2020-08-08 DIAGNOSIS — Z91040 Latex allergy status: Secondary | ICD-10-CM

## 2020-08-08 DIAGNOSIS — Z79899 Other long term (current) drug therapy: Secondary | ICD-10-CM

## 2020-08-08 DIAGNOSIS — I2510 Atherosclerotic heart disease of native coronary artery without angina pectoris: Secondary | ICD-10-CM | POA: Diagnosis not present

## 2020-08-08 DIAGNOSIS — I959 Hypotension, unspecified: Secondary | ICD-10-CM | POA: Diagnosis present

## 2020-08-08 LAB — CBC WITH DIFFERENTIAL/PLATELET
Abs Immature Granulocytes: 0.16 10*3/uL — ABNORMAL HIGH (ref 0.00–0.07)
Basophils Absolute: 0.1 10*3/uL (ref 0.0–0.1)
Basophils Relative: 0 %
Eosinophils Absolute: 0.1 10*3/uL (ref 0.0–0.5)
Eosinophils Relative: 1 %
HCT: 42.2 % (ref 36.0–46.0)
Hemoglobin: 13.8 g/dL (ref 12.0–15.0)
Immature Granulocytes: 1 %
Lymphocytes Relative: 8 %
Lymphs Abs: 1.8 10*3/uL (ref 0.7–4.0)
MCH: 29.9 pg (ref 26.0–34.0)
MCHC: 32.7 g/dL (ref 30.0–36.0)
MCV: 91.5 fL (ref 80.0–100.0)
Monocytes Absolute: 0.9 10*3/uL (ref 0.1–1.0)
Monocytes Relative: 4 %
Neutro Abs: 18.7 10*3/uL — ABNORMAL HIGH (ref 1.7–7.7)
Neutrophils Relative %: 86 %
Platelets: 594 10*3/uL — ABNORMAL HIGH (ref 150–400)
RBC: 4.61 MIL/uL (ref 3.87–5.11)
RDW: 14 % (ref 11.5–15.5)
WBC: 21.7 10*3/uL — ABNORMAL HIGH (ref 4.0–10.5)
nRBC: 0 % (ref 0.0–0.2)

## 2020-08-08 LAB — RESP PANEL BY RT-PCR (FLU A&B, COVID) ARPGX2
Influenza A by PCR: NEGATIVE
Influenza B by PCR: NEGATIVE
SARS Coronavirus 2 by RT PCR: NEGATIVE

## 2020-08-08 LAB — COMPREHENSIVE METABOLIC PANEL
ALT: 16 U/L (ref 0–44)
AST: 20 U/L (ref 15–41)
Albumin: 2.6 g/dL — ABNORMAL LOW (ref 3.5–5.0)
Alkaline Phosphatase: 69 U/L (ref 38–126)
Anion gap: 13 (ref 5–15)
BUN: 35 mg/dL — ABNORMAL HIGH (ref 8–23)
CO2: 23 mmol/L (ref 22–32)
Calcium: 8.2 mg/dL — ABNORMAL LOW (ref 8.9–10.3)
Chloride: 109 mmol/L (ref 98–111)
Creatinine, Ser: 1.46 mg/dL — ABNORMAL HIGH (ref 0.44–1.00)
GFR, Estimated: 37 mL/min — ABNORMAL LOW (ref >60)
Glucose, Bld: 144 mg/dL — ABNORMAL HIGH (ref 70–99)
Potassium: 3.5 mmol/L (ref 3.5–5.1)
Sodium: 145 mmol/L (ref 135–145)
Total Bilirubin: 0.7 mg/dL (ref 0.3–1.2)
Total Protein: 6.6 g/dL (ref 6.5–8.1)

## 2020-08-08 LAB — URINALYSIS, COMPLETE (UACMP) WITH MICROSCOPIC
Bacteria, UA: NONE SEEN
Bilirubin Urine: NEGATIVE
Glucose, UA: NEGATIVE mg/dL
Hgb urine dipstick: NEGATIVE
Ketones, ur: 5 mg/dL — AB
Nitrite: NEGATIVE
Protein, ur: 30 mg/dL — AB
Specific Gravity, Urine: 1.018 (ref 1.005–1.030)
Squamous Epithelial / HPF: NONE SEEN (ref 0–5)
pH: 5 (ref 5.0–8.0)

## 2020-08-08 LAB — GLUCOSE, CAPILLARY: Glucose-Capillary: 132 mg/dL — ABNORMAL HIGH (ref 70–99)

## 2020-08-08 LAB — PROCALCITONIN: Procalcitonin: <0.1 ng/mL

## 2020-08-08 LAB — TROPONIN I (HIGH SENSITIVITY)
Troponin I (High Sensitivity): 14 ng/L (ref <18)
Troponin I (High Sensitivity): 17 ng/L (ref <18)

## 2020-08-08 LAB — LACTIC ACID, PLASMA
Lactic Acid, Venous: 2.9 mmol/L (ref 0.5–1.9)
Lactic Acid, Venous: 2.8 mmol/L (ref 0.5–1.9)
Lactic Acid, Venous: 4 mmol/L (ref 0.5–1.9)
Lactic Acid, Venous: 2.8 mmol/L (ref 0.5–1.9)

## 2020-08-08 MED ORDER — ONDANSETRON HCL 4 MG PO TABS: 4.0000 mg | ORAL_TABLET | Freq: Four times a day (QID) | ORAL | Status: DC | PRN

## 2020-08-08 MED ORDER — ACETAMINOPHEN ER 650 MG PO TBCR: 1300.0000 mg | EXTENDED_RELEASE_TABLET | Freq: Three times a day (TID) | ORAL | Status: DC | PRN

## 2020-08-08 MED ORDER — CHLORHEXIDINE GLUCONATE CLOTH 2 % EX PADS
6.0000 | MEDICATED_PAD | Freq: Every day | CUTANEOUS | Status: DC
  Administered 2020-08-09 – 2020-08-11 (×3): 6 via TOPICAL

## 2020-08-08 MED ORDER — LACTATED RINGERS IV BOLUS
500.0000 mL | Freq: Once | INTRAVENOUS | Status: AC
  Administered 2020-08-08: 500 mL via INTRAVENOUS

## 2020-08-08 MED ORDER — NOREPINEPHRINE 4 MG/250ML-% IV SOLN: 2.0000 ug/min | INTRAVENOUS | Status: DC

## 2020-08-08 MED ORDER — LACTATED RINGERS IV BOLUS
1000.0000 mL | Freq: Once | INTRAVENOUS | Status: AC
  Administered 2020-08-08: 1000 mL via INTRAVENOUS

## 2020-08-08 MED ORDER — LACTATED RINGERS IV SOLN
INTRAVENOUS | Status: DC
  Administered 2020-08-10: 75 mL/h via INTRAVENOUS

## 2020-08-08 MED ORDER — ENOXAPARIN SODIUM 30 MG/0.3ML ~~LOC~~ SOLN
30.0000 mg | SUBCUTANEOUS | Status: DC
  Administered 2020-08-08: 30 mg via SUBCUTANEOUS
  Filled 2020-08-08: qty 0.3

## 2020-08-08 MED ORDER — ENOXAPARIN SODIUM 40 MG/0.4ML ~~LOC~~ SOLN: 40.0000 mg | SUBCUTANEOUS | Status: DC

## 2020-08-08 MED ORDER — ONDANSETRON HCL 4 MG/2ML IJ SOLN: 4.0000 mg | Freq: Four times a day (QID) | INTRAMUSCULAR | Status: DC | PRN

## 2020-08-08 MED ORDER — SODIUM CHLORIDE 0.9 % IV SOLN
250.0000 mL | INTRAVENOUS | Status: DC
  Administered 2020-08-09: 250 mL via INTRAVENOUS

## 2020-08-08 MED ORDER — IOHEXOL 350 MG/ML SOLN
75.0000 mL | Freq: Once | INTRAVENOUS | Status: AC | PRN
  Administered 2020-08-08: 75 mL via INTRAVENOUS

## 2020-08-08 MED ORDER — PANTOPRAZOLE SODIUM 40 MG IV SOLR
40.0000 mg | INTRAVENOUS | Status: DC
  Administered 2020-08-08 – 2020-08-09 (×2): 40 mg via INTRAVENOUS
  Filled 2020-08-08 (×2): qty 40

## 2020-08-08 MED ORDER — SODIUM CHLORIDE 0.9 % IV SOLN
2.0000 g | Freq: Once | INTRAVENOUS | Status: DC
  Filled 2020-08-08: qty 2

## 2020-08-08 MED ORDER — ACETAMINOPHEN 500 MG PO TABS
1000.0000 mg | ORAL_TABLET | Freq: Four times a day (QID) | ORAL | Status: DC | PRN
  Administered 2020-08-09: 1000 mg via ORAL
  Filled 2020-08-08: qty 2

## 2020-08-08 MED ORDER — SODIUM CHLORIDE 0.9 % IV SOLN
2.0000 g | INTRAVENOUS | Status: DC
  Filled 2020-08-08: qty 2

## 2020-08-08 MED ORDER — METRONIDAZOLE IN NACL 5-0.79 MG/ML-% IV SOLN
500.0000 mg | Freq: Once | INTRAVENOUS | Status: AC
  Administered 2020-08-08: 500 mg via INTRAVENOUS
  Filled 2020-08-08: qty 100

## 2020-08-08 MED ORDER — SODIUM CHLORIDE 0.9 % IV SOLN
2.0000 g | Freq: Once | INTRAVENOUS | Status: AC
  Administered 2020-08-08: 2 g via INTRAVENOUS
  Filled 2020-08-08: qty 2

## 2020-08-08 NOTE — ED Notes (Signed)
Dr Larinda Buttery notified of lactic 2.9, to place orders as needed

## 2020-08-08 NOTE — Sepsis Progress Note (Signed)
eLink is monitoring this Code Sepsis. °

## 2020-08-08 NOTE — Progress Notes (Signed)
CODE SEPSIS - PHARMACY COMMUNICATION  **Broad Spectrum Antibiotics should be administered within 1 hour of Sepsis diagnosis**  Time Code Sepsis Called/Page Received: 7530  Antibiotics Ordered: Cefepime 2g IV x 1, Metronidazole 500mg  IV x 1  Time of 1st antibiotic administration: 0802  Additional action taken by pharmacy: none  If necessary, Name of Provider/Nurse Contacted: n/a    [id number] ,PharmD Clinical Pharmacist  08/08/2020  8:03 AM

## 2020-08-08 NOTE — Progress Notes (Signed)
Neuro: will awaken to voice, answer questions mostly appropriately, cannot consistently answer orientation questions, contractures to lower extremities, very stiff upper extremities Resp: stable on 2L [place] CV: afebrile, vital signs more stable following bolus treatment in ED GIGU: purewick in place, no BM, NPO Skin: dry, flaky, pre-existing dermatitis Social: Son and Daughter in law visited shortly after pt's arrival from ED, they have requested a consult with hospice/palliative care to assist in making care goals, they both acknowledge she requires more care/treatment than what she is getting at the [facility], they are unable to provide care at their own home.   Events: New admission from ED.

## 2020-08-08 NOTE — Sepsis Progress Note (Signed)
Notified provider of need to order repeat lactic acid. ° °

## 2020-08-08 NOTE — H&P (Addendum)
History and Physical    Andrea Barker HQI:[id number] DOB: [birth year] DOA: 08/08/2020  PCP: Almetta Lovely, [facility]   Patient coming from: [facility]  I have personally briefly reviewed patient's old medical records in [facility] Link Most of the history is obtained from the ER notes.  Patient is unable to provide any history. Chief Complaint: Change in mental status  HPI: Andrea Barker is a 80 y.o. female with medical history significant for recent hospitalization for UTI, hypertension, chronic venous stasis GERD, dementia, chronic respiratory failure on 2 L of oxygen bedbound status who was sent to the patient for evaluation for possible sepsis.  Per EMS patient was hypoxic and her oxygen had to be increased to 4 L to maintain pulse oximetry greater than 92%. Per ER notes patient was at her baseline at 6 AM and her son had come to visit her later in the day and found her less responsive.  She seemed slumped over to one side.  Per EMS her initial blood pressure was 60 systolic.  She received 500 cc IV fluid bolus en route to the hospital. Patient is seen in the ER and is lethargic but arouses to loud verbal stimuli.  She was hypotensive upon arrival to the ER with blood pressure in the 70s systolic and received 2 L IV fluid with improvement in her blood pressure to 90s systolic. Unable to do review of systems due to her underlying dementia Labs reviewed sodium 145, potassium 3.5, chloride 109, bicarb 23, glucose 144, BUN 35, creatinine 1.46, calcium 8.2, alkaline phosphatase 69, albumin 2.6, AST 20, ALT 16, total protein 6.6, troponin XVII, folic acid 11.5, lactic acid 2.9 >> 2.8, white count 21.7, hemoglobin 13.8, hematocrit 42.2, MCV 91.5, RDW 14.0, platelet count 594, PT 15.3, INR 1.3 Respiratory viral panel is negative Renal stone CT shows  wall thickening and surrounding mesenteric inflammatory change extending from the cecum to the distal transverse  colon. This appearance is consistent with colitis. This distribution of colitis raises concern for ischemic colitis. Note that there is extensive atherosclerotic changes noted at multiple sites. No bowel pneumatosis evident. Most small and large bowel loops are fluid-filled which may be secondary to a degree of underlying enterocolitis of uncertain etiology. Note that there is no appreciable small bowel thickening. A degree of ischemic enteritis is a possibility in this circumstance, however. No bowel obstruction. No abscess in the abdomen or pelvis. No appendiceal region inflammation.There is a mass arising from the lateral right kidney measuring 1.4 x 0.9 cm. This mass cannot be classified as a cyst. A small renal cell carcinoma could present in this manner. Further evaluation with pre and post contrast MRI should be considered. Pre and post contrast CT could alternatively be performed, but would likely be of decreased accuracy given lesion size. Bibasilar apparent pneumonia with left pleural effusion. Moderate pericardial effusion. 1 mm calculus upper pole right kidney. No hydronephrosis or ureteral calculus on either side. CT scan of the head without contrast shows no acute abnormality no interval change. Atrophy and chronic microvascular ischemic change in the white matter. Chest x-ray reviewed by me shows mild left base atelectasis. No edema or airspace opacity. Stable cardiac silhouette. Stable elevation of the right hemidiaphragm. Aortic Atherosclerosis . Twelve-lead EKG reviewed by me shows sinus rhythm with PVCs and right axis deviation  ED Course: Patient is a 80 year old female who presents from the skilled nursing facility and was brought by EMS for evaluation of mental status  changes.  Patient was hypotensive in the field with systolic blood pressure in the 60s and received 2 L IV fluid bolus in the ER with improvement in her blood pressure.  Patient was noted to have elevated lactic  acid levels as well as marked leukocytosis with imaging suggestive of possible bowel ischemia versus enterocolitis.  ER physician discussed his concerns of possible ischemic colitis with vascular surgery who recommended a CT angiogram, results are pending at the time of this H&P.  She received a dose of cefepime and Flagyl in the ER and will be admitted to the hospital for further evaluation.     Review of Systems: As per HPI otherwise all other systems reviewed and negative.    Past Medical History:  Diagnosis Date  . Anxiety   . Arthritis   . GERD (gastroesophageal reflux disease)   . Hypertension   . Seasonal allergic rhinitis     Past Surgical History:  Procedure Laterality Date  . PARTIAL HYSTERECTOMY       reports that she has quit smoking. She has never used smokeless tobacco. She reports current alcohol use. She reports that she does not use drugs.  Allergies  Allergen Reactions  . Avelox [Moxifloxacin Hcl In Nacl] Swelling  . Calamine     RASH  . Clindamycin Diarrhea and Nausea Only  . Clindamycin/Lincomycin Nausea And Vomiting  . Doxepin     Other reaction(s): Diarrhea  . Fluorouracil     Other reaction(s): Rash  . Indomethacin Nausea And Vomiting  . Levaquin [Levofloxacin] Diarrhea  . Minocycline Diarrhea and Nausea Only  . Penicillins Swelling  . Sulfa Antibiotics Hives  . Vancomycin Swelling  . Latex Rash  . Neomycin-Bacitracin Zn-Polymyx Rash    No family history on file.    Prior to Admission medications   Medication Sig Start Date End Date Taking? Authorizing Provider  acetaminophen (TYLENOL) 650 MG CR tablet Take 1,300 mg by mouth every 8 (eight) hours as needed for pain.    [provider]  amLODipine (NORVASC) 10 MG tablet Take 10 mg by mouth daily.    [provider]  Ascorbic Acid (VITAMIN C) 100 MG tablet Take 100 mg by mouth daily.    [provider]  cephALEXin (KEFLEX) 250 MG capsule Take 1 capsule (250 mg total)  by mouth 4 (four) times daily. 08/03/20   Wouk, Wilfred Curtis, MD  cetirizine (ZYRTEC) 10 MG tablet Take 10 mg by mouth daily.    [provider]  diclofenac Sodium (VOLTAREN) 1 % GEL Apply 2 g topically in the morning, at noon, in the evening, and at bedtime. 05/15/20   [provider]  docusate sodium (COLACE) 100 MG capsule Take 200 mg by mouth daily.    [provider]  esomeprazole (NEXIUM) 20 MG capsule Take 20 mg by mouth daily at 12 noon.    [provider]  fentaNYL (DURAGESIC - DOSED MCG/HR) 100 MCG/HR Place 1 patch onto the skin every 3 (three) days.    [provider]  fluticasone (FLONASE) 50 MCG/ACT nasal spray Place into both nostrils daily.    [provider]  gabapentin (NEURONTIN) 300 MG capsule Take 300 mg by mouth at bedtime. 07/19/20   [provider]  hydrochlorothiazide (HYDRODIURIL) 12.5 MG tablet Take 12.5 mg by mouth daily. 07/31/20   [provider]  lidocaine (LIDODERM) 5 % Place 1 patch onto the skin daily. Remove & Discard patch within 12 hours or as directed by MD  [provider]  losartan (COZAAR) 100 MG tablet Take 100 mg by mouth daily. 07/31/20   [provider]  magnesium oxide (MAG-OX) 400 MG tablet Take 400 mg by mouth 2 (two) times daily.    [provider]  Multiple Vitamins-Minerals (MULTIVITAMIN WITH MINERALS) tablet Take 1 tablet by mouth daily.    [provider]  nystatin cream (MYCOSTATIN) Apply 1 application topically 2 (two) times daily. 07/04/20   [provider]  Oxycodone HCl 10 MG TABS Take 0.5 tablets (5 mg total) by mouth every 6 (six) hours as needed. 08/03/20   Wouk, Wilfred Curtis, MD  polyethylene glycol (MIRALAX / GLYCOLAX) 17 g packet Take 17 g by mouth daily as needed for mild constipation.    [provider]  promethazine (PHENERGAN) 25 MG tablet Take 25 mg by mouth every 6 (six) hours as needed for nausea or vomiting.     [provider]  sertraline (ZOLOFT) 50 MG tablet Take 50 mg by mouth daily.    [provider]    Physical Exam: Vitals:   08/08/20 1030 08/08/20 1045 08/08/20 1115 08/08/20 1218  BP: (!) 105/58   (!) 146/79  Pulse: (!) 42 86    Resp: Temp:    97.9 F (36.6 C)  TempSrc:    Oral  SpO2: 97% 98%    Weight:      Height:         Vitals:   08/08/20 1030 08/08/20 1045 08/08/20 1115 08/08/20 1218  BP: (!) 105/58   (!) 146/79  Pulse: (!) 42 86    Resp: Temp:    97.9 F (36.6 C)  TempSrc:    Oral  SpO2: 97% 98%    Weight:      Height:          Constitutional:  Lethargic, opens eyes to loud verbal stimuli. Not in any apparent distress HEENT:      Head: Normocephalic and atraumatic.         Eyes: PERLA, EOMI, Conjunctivae pallor. Sclera is non-icteric.       Mouth/Throat:  Dry mucous membranes        Neck: Supple with no signs of meningismus. Cardiovascular: Regular rate and rhythm. No murmurs, gallops, or rubs. 2+ symmetrical distal pulses are present . No JVD. No LE edema Respiratory:  Bilateral air entry. No wheezes, crackles at the bases, no rhonchi.  Gastrointestinal: Soft, non tender, and non distended with positive bowel sounds.  Genitourinary: No CVA tenderness. Musculoskeletal:  Bilateral foot drop Neurologic:  Face is symmetric.  Appears very weak.  No spontaneous movements of her extremities Skin: Skin is warm, dry.  Rash over both lower extremities Psychiatric: Unable to assess   Labs on Admission: I have personally reviewed following labs and imaging studies  CBC: Recent Labs  Lab 08/08/20 0742  WBC 21.7*  NEUTROABS 18.7*  HGB 13.8  HCT 42.2  MCV 91.5  PLT 594*   Basic Metabolic Panel: Recent Labs  Lab 08/02/20 0655 08/08/20 0742  NA 144 145  K 3.7 3.5  CL 110 109  CO2 25 23  GLUCOSE 105* 144*  BUN 32* 35*  CREATININE 1.09* 1.46*  CALCIUM 8.8* 8.2*  MG 2.3  --   PHOS 3.6  --    GFR: Estimated  Creatinine Clearance: 28.6 mL/min (A) (by C-G formula based on SCr of 1.46 mg/dL (H)). Liver Function Tests: Recent Labs  Lab  08/08/20 0742  AST 20  ALT 16  ALKPHOS 69  BILITOT 0.7  PROT 6.6  ALBUMIN 2.6*   No results for input(s): LIPASE, AMYLASE in the last 168 hours. No results for input(s): AMMONIA in the last 168 hours. Coagulation Profile: No results for input(s): INR, PROTIME in the last 168 hours. Cardiac Enzymes: No results for input(s): CKTOTAL, CKMB, CKMBINDEX, TROPONINI in the last 168 hours. BNP (last 3 results) No results for input(s): PROBNP in the last 8760 hours. HbA1C: No results for input(s): HGBA1C in the last 72 hours. CBG: Recent Labs  Lab 08/08/20 1224  GLUCAP 132*   Lipid Profile: No results for input(s): CHOL, HDL, LDLCALC, TRIG, CHOLHDL, LDLDIRECT in the last 72 hours. Thyroid Function Tests: No results for input(s): TSH, T4TOTAL, FREET4, T3FREE, THYROIDAB in the last 72 hours. Anemia Panel: No results for input(s): VITAMINB12, FOLATE, FERRITIN, TIBC, IRON, RETICCTPCT in the last 72 hours. Urine analysis:    Component Value Date/Time   COLORURINE YELLOW (A) 08/08/2020 0852   APPEARANCEUR HAZY (A) 08/08/2020 0852   LABSPEC 1.018 08/08/2020 0852   PHURINE 5.0 08/08/2020 0852   GLUCOSEU NEGATIVE 08/08/2020 0852   HGBUR NEGATIVE 08/08/2020 0852   BILIRUBINUR NEGATIVE 08/08/2020 0852   KETONESUR 5 (A) 08/08/2020 0852   PROTEINUR 30 (A) 08/08/2020 0852   NITRITE NEGATIVE 08/08/2020 0852   LEUKOCYTESUR TRACE (A) 08/08/2020 0852    Radiological Exams on Admission: CT Head Wo Contrast  Result Date: 08/08/2020 CLINICAL DATA:  Mental status change.  Sepsis alert. EXAM: CT HEAD WITHOUT CONTRAST TECHNIQUE: Contiguous axial images were obtained from the base of the skull through the vertex without intravenous contrast. COMPARISON:  CT head 07/31/2020 FINDINGS: Brain: Generalized atrophy unchanged. White matter hypodensity bilaterally, unchanged. No  acute infarct, hemorrhage, mass.  No interval change. Vascular: Negative for hyperdense vessel Skull: Negative Sinuses/Orbits: Paranasal sinuses clear. Bilateral cataract extraction. Other: None IMPRESSION: No acute abnormality no interval change Atrophy and chronic microvascular ischemic change in the white matter. Electronically Signed   By: Marlan Palau M.D.   On: 08/08/2020 09:40   DG Chest Portable 1 View  Result Date: 08/08/2020 CLINICAL DATA:  Weakness EXAM: PORTABLE CHEST 1 VIEW COMPARISON:  July 31, 2020 FINDINGS: There is stable elevation of the right hemidiaphragm. There is mild atelectatic change in the left base. Lungs elsewhere are clear. Heart is upper normal in size with pulmonary vascularity normal. No adenopathy. No bone lesions. IMPRESSION: Mild left base atelectasis. No edema or airspace opacity. Stable cardiac silhouette. Stable elevation of the right hemidiaphragm. Aortic Atherosclerosis (ICD10-I70.0). Electronically Signed   By: Bretta Bang III M.D.   On: 08/08/2020 08:17   CT Renal Stone Study  Result Date: 08/08/2020 CLINICAL DATA:  Abdominal pain EXAM: CT ABDOMEN AND PELVIS WITHOUT CONTRAST TECHNIQUE: Multidetector CT imaging of the abdomen and pelvis was performed following the standard protocol without oral or IV contrast. COMPARISON:  None. FINDINGS: Lower chest: There is a moderate pericardial effusion. There are foci of coronary artery calcification. There is a left pleural effusion. There are areas of airspace consolidation in each lung base, somewhat more on the left than on the right. Hepatobiliary: No focal liver lesions are evident on this noncontrast enhanced study. The gallbladder wall does not appear appreciably thickened. There is no appreciable biliary duct dilatation. Pancreas: No pancreatic mass or inflammatory focus. Spleen: No splenic lesions are evident. Adrenals/Urinary Tract: Adrenals bilaterally appear unremarkable. Left kidney is somewhat atrophic.  Apparent degree of compensatory hypertrophy right  kidney. There is a mass arising from the lateral mid right kidney measuring 1.4 x 0.9 cm which has attenuation values higher than is expected with a cyst. There is a cyst arising from the posterior upper right kidney measuring 1.7 x 1.7 cm. There is a cyst arising from the upper pole of the left kidney measuring 2.6 x 2.4 cm. No appreciable hydronephrosis on either side. There is a 1 mm calculus in the upper pole the right kidney. There is no evident ureteral calculus on either side. Urinary bladder is midline. Urinary bladder wall is borderline thickened. Stomach/Bowel: Most bowel loops are fluid-filled. There is wall thickening with adjacent mesenteric stranding extending from the cecum to the level of the splenic flexure. No pneumatosis evident in this area. No similar changes elsewhere involving bowel. No appreciable bowel obstruction. The terminal ileum appears unremarkable. No appreciable free air or portal venous air. Appendix region appears unremarkable. Vascular/Lymphatic: No abdominal aortic aneurysm. There is extensive aortic and iliac artery atherosclerosis. There is apparent atherosclerotic plaque at the origins of the celiac and superior mesenteric arteries. No adenopathy is appreciable in the abdomen or pelvis. Reproductive: Uterus absent.  No adnexal masses are appreciable. Other: No abscess or ascites in the abdomen or pelvis. Musculoskeletal: Postoperative change noted in proximal right femur. Bones are osteoporotic. There is severe narrowing of each hip joint. There is marked lumbar levoscoliosis. No blastic or lytic bone lesions are evident. No intramuscular lesions are evident. Note that there is pelvic muscle atrophy bilaterally. IMPRESSION: 1. There is wall thickening and surrounding mesenteric inflammatory change extending from the cecum to the distal transverse colon. This appearance is consistent with colitis. This distribution of colitis  raises concern for ischemic colitis. Note that there is extensive atherosclerotic changes noted at multiple sites. No bowel pneumatosis evident. 2. Most small and large bowel loops are fluid-filled which may be secondary to a degree of underlying enterocolitis of uncertain etiology. Note that there is no appreciable small bowel thickening. A degree of ischemic enteritis is a possibility in this circumstance, however. 3. No bowel obstruction. No abscess in the abdomen or pelvis. No appendiceal region inflammation. 4. There is a mass arising from the lateral right kidney measuring 1.4 x 0.9 cm. This mass cannot be classified as a cyst. A small renal cell carcinoma could present in this manner. Further evaluation with pre and post contrast MRI should be considered. Pre and post contrast CT could alternatively be performed, but would likely be of decreased accuracy given lesion size. 5.  Bibasilar apparent pneumonia with left pleural effusion. 6.  Moderate pericardial effusion. 7. 1 mm calculus upper pole right kidney. No hydronephrosis or ureteral calculus on either side. 8. Question a degree of cystitis given mild thickening of the urinary bladder wall. 9.  Uterus absent. 10. Extensive osteoporosis. Arthropathy in the hips and lumbar spine region. Aortic Atherosclerosis (ICD10-I70.0). Electronically Signed   By: Bretta Bang III M.D.   On: 08/08/2020 09:52     Assessment/Plan Principal Problem:   Sepsis (HCC) Active Problems:   Altered mental status   Failure to thrive in adult   Essential hypertension   CKD (chronic kidney disease), stage III (HCC)   Debility   Pneumonia   Ischemic colitis (HCC)    Sepsis (POA) As evidenced by hypotension requiring IV fluid resuscitation, marked leukocytosis as well as elevated lactic acid level Source of sepsis appears to be possible pneumonia versus colitis (ischemic to rule out C. difficile colitis) Patient was recently  hospitalized for UTI and urine  cultures yielded Proteus mirabilis.  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  Aspiration We will start patient on cefepime She will need a swallow function evaluation 1 more week    Dehydration Secondary to poor oral intake and concomitant diuretic therapy Patient with dry mucous membranes Baseline serum creatinine is about 1.09 on admission it is 1.42 We will hydrate patient Repeat renal parameters in a.m.    DVT prophylaxis: Lovenox Code Status: DO NOT RESUSCITATE Family Communication: Greater than 50% of time was spent discussing patient's condition and plan of care with her son Burt Knack.  All questions and concerns have been addressed.  He verbalizes understanding and agrees with the plan. Disposition Plan: Back to previous home environment Consults called: None Status: At the time of admission, it appears that the appropriate admission status for this patient is inpatient.  This is judged to be reasonable and necessary in order to provide the required intensity of service to ensure the patient's safety given the presenting symptoms, physical exam findings and initial radiographic and  laboratory data in the context of their comorbid conditions. Patient requires inpatient status due to high intensity of service, high risk for further deterioration and high frequency of surveillance required.    Lucile Shutters MD [facility]     08/08/2020, 12:29 PM

## 2020-08-08 NOTE — Consult Note (Signed)
[facility] Vascular Consult Note  MRN : [id number]  Andrea Barker is a 80 y.o. (22-Dec-1940) female who presents with chief complaint of  Chief Complaint  Patient presents with  . Code Sepsis  .  History of Present Illness: I am asked to see the patient by Dr. Larinda Buttery in the [facility] ER for possible mesenteric ischemia.  The patient is admitted with dehydration, and elevated lactate, and lethargy.  She had some vague whole-body pain when she first came in but now she is a little more alert and awake and is not complaining of any pain.  She is a fair at best historian.  She is being treated for sepsis as possible aspiration pneumonia as well as a CT finding.  She underwent an uninfused CT scan of the abdomen potentially for renal stones that was unrevealing.  This did show thickening of the colon worrisome for ischemic colitis.  We were contacted and recommended obtaining a CT angiogram of the abdomen pelvis to evaluate her perfusion.  I reviewed both the CT angiogram and the uninfused CT scan of the abdomen pelvis.  The CT angiogram demonstrates what appears to be stenosis of the celiac artery but a widely patent SMA and the IMA is also patent but small as would be expected.  Current Facility-Administered Medications  Medication Dose Route Frequency Provider Last Rate Last Admin  . 0.9 %  sodium chloride infusion  250 mL Intravenous Continuous Chesley Noon, MD   Held at 08/08/20 1017  . acetaminophen (TYLENOL) tablet 1,000 mg  1,000 mg Oral Q6H PRN Agbata, Tochukwu, MD      . Melene Muller ON 08/09/2020] ceFEPIme (MAXIPIME) 2 g in sodium chloride 0.9 % 100 mL IVPB  2 g Intravenous Q24H Lowella Bandy, RPH      . enoxaparin (LOVENOX) injection 30 mg  30 mg Subcutaneous Q24H Lowella Bandy, RPH      . lactated ringers infusion   Intravenous Continuous Agbata, Tochukwu, MD 125 mL/hr at 08/08/20 1400 Infusion Verify at 08/08/20 1400  . norepinephrine (LEVOPHED) 4mg  in premix  infusion  2-10 mcg/min Intravenous Titrated , MD   Held at 08/08/20 1016  . ondansetron (ZOFRAN) tablet 4 mg  4 mg Oral Q6H PRN Agbata, Tochukwu, MD       Or  . ondansetron (ZOFRAN) injection 4 mg  4 mg Intravenous Q6H PRN Agbata, Tochukwu, MD      . pantoprazole (PROTONIX) injection 40 mg  40 mg Intravenous Q24H Agbata, Tochukwu, MD   40 mg at 08/08/20 1620    Past Medical History:  Diagnosis Date  . Anxiety   . Arthritis   . GERD (gastroesophageal reflux disease)   . Hypertension   . Seasonal allergic rhinitis     Past Surgical History:  Procedure Laterality Date  . PARTIAL HYSTERECTOMY       Social History   Tobacco Use  . Smoking status: Former 10/08/20  . Smokeless tobacco: Never Used  Substance Use Topics  . Alcohol use: Yes    Comment: occaional  . Drug use: No    Family History No reported history of bleeding disorders, clotting disorders, or aneurysms  Allergies  Allergen Reactions  . Avelox [Moxifloxacin Hcl In Nacl] Swelling  . Calamine     RASH  . Clindamycin Diarrhea and Nausea Only  . Clindamycin/Lincomycin Nausea And Vomiting  . Doxepin     Other reaction(s): Diarrhea  . Fluorouracil     Other reaction(s):  Rash  . Indomethacin Nausea And Vomiting  . Levaquin [Levofloxacin] Diarrhea  . Minocycline Diarrhea and Nausea Only  . Penicillins Swelling  . Sulfa Antibiotics Hives  . Vancomycin Swelling  . Latex Rash  . Neomycin-Bacitracin Zn-Polymyx Rash     REVIEW OF SYSTEMS (Negative unless checked)  Constitutional: Weight loss  Fever  Chills Cardiac: Chest pain   Chest pressure   Palpitations   Shortness of breath when laying flat   Shortness of breath at rest   Shortness of breath with exertion. Vascular:  Pain in legs with walking   Pain in legs at rest   Pain in legs when laying flat   Claudication   Pain in feet when walking  Pain in feet at rest  Pain in feet when laying flat   History of DVT    Phlebitis   Swelling in legs   Varicose veins   Non-healing ulcers Pulmonary:   Uses home oxygen   Productive cough   Hemoptysis   Wheeze  COPD   Asthma Neurologic:  Dizziness  Blackouts   Seizures   History of stroke   History of TIA  Aphasia   Temporary blindness   Dysphagia   Weakness or numbness in arms   Weakness or numbness in legs X positive for some dementia Musculoskeletal:  Arthritis   Joint swelling   Joint pain   Low back pain Hematologic:  Easy bruising  Easy bleeding   Hypercoagulable state   Anemic  Hepatitis Gastrointestinal:  Blood in stool   Vomiting blood  Gastroesophageal reflux/heartburn   Difficulty swallowing. Genitourinary:  Chronic kidney disease   Difficult urination  Frequent urination  Burning with urination   Blood in urine Skin:  Rashes   Ulcers   Wounds Psychological:  History of anxiety    History of major depression.  Physical Examination  Vitals:   08/08/20 1430 08/08/20 1500 08/08/20 1530 08/08/20 1600  BP: 128/66 128/64 127/63 127/64  Pulse: 71 (!) 49    Resp: Temp:    98 F (36.7 C)  TempSrc:    Oral  SpO2: (!) 82% (!) 81%  98%  Weight:      Height:       Body mass index is 21.28 kg/m. Gen: Frail and debilitated elderly female in the ICU, NAD.  Very kyphotic and scoliotic. Head: [place]/AT, + temporalis wasting. Ear/Nose/Throat: Hearing grossly intact, nares w/o erythema or drainage, oropharynx w/o Erythema/Exudate Eyes: Sclera non-icteric, conjunctiva clear Neck: Trachea midline.  No JVD.  Pulmonary:  Good air movement, respirations not labored, equal bilaterally.  Cardiac: Irregular Vascular:  Vessel Right Left  Radial Palpable Palpable                                   Gastrointestinal: soft, non-tender/non-distended.  Musculoskeletal: M/S 5/5 throughout.  Extremities without ischemic changes.  No deformity or atrophy. No  edema. Neurologic: Sensation grossly intact in extremities.  Symmetrical.  Speech is limited. Motor exam as listed above. Psychiatric: Judgment and insight are fairly poor Dermatologic: No rashes or ulcers noted.  No cellulitis or open wounds.       CBC Lab Results  Component Value Date   WBC 21.7 (H) 08/08/2020   HGB 13.8 08/08/2020   HCT 42.2 08/08/2020   MCV 91.5 08/08/2020   PLT 594 (H) 08/08/2020    BMET    Component Value Date/Time  NA 145 08/08/2020 0742   K 3.5 08/08/2020 0742   CL 109 08/08/2020 0742   CO2 23 08/08/2020 0742   GLUCOSE 144 (H) 08/08/2020 0742   BUN 35 (H) 08/08/2020 0742   CREATININE 1.46 (H) 08/08/2020 0742   CALCIUM 8.2 (L) 08/08/2020 0742   GFRNONAA 37 (L) 08/08/2020 0742   GFRAA 36 (L) 06/16/2017 2230   Estimated Creatinine Clearance: 28.6 mL/min (A) (by C-G formula based on SCr of 1.46 mg/dL (H)).  COAG Lab Results  Component Value Date   INR 1.3 (H) 08/01/2020   INR 1.2 07/31/2020    Radiology DG Chest 2 View  Result Date: 07/31/2020 CLINICAL DATA:  Cough and chest congestion EXAM: CHEST - 2 VIEW COMPARISON:  12/20/2011 FINDINGS: Cardiac shadow is stable. Aortic calcifications are again seen. Elevation of the right hemidiaphragm is noted. Lungs are clear. No sizable effusion is noted. IMPRESSION: No active cardiopulmonary disease. Electronically Signed   By: Alcide Clever M.D.   On: 07/31/2020 15:56   CT Head Wo Contrast  Result Date: 08/08/2020 CLINICAL DATA:  Mental status change.  Sepsis alert. EXAM: CT HEAD WITHOUT CONTRAST TECHNIQUE: Contiguous axial images were obtained from the base of the skull through the vertex without intravenous contrast. COMPARISON:  CT head 07/31/2020 FINDINGS: Brain: Generalized atrophy unchanged. White matter hypodensity bilaterally, unchanged. No acute infarct, hemorrhage, mass.  No interval change. Vascular: Negative for hyperdense vessel Skull: Negative Sinuses/Orbits: Paranasal sinuses clear.  Bilateral cataract extraction. Other: None IMPRESSION: No acute abnormality no interval change Atrophy and chronic microvascular ischemic change in the white matter. Electronically Signed   By: Marlan Palau M.D.   On: 08/08/2020 09:40   CT Head Wo Contrast  Result Date: 07/31/2020 CLINICAL DATA:  Mental status changes, unknown cause. Hypoxia. Lethargy. EXAM: CT HEAD WITHOUT CONTRAST TECHNIQUE: Contiguous axial images were obtained from the base of the skull through the vertex without intravenous contrast. COMPARISON:  None. FINDINGS: Brain: Moderate central and cortical atrophy. Prominent LATERAL and third ventricles. There is periventricular white matter change. There is no intra or extra-axial fluid collection or mass lesion. The basilar cisterns and ventricles have a normal appearance. There is no CT evidence for acute infarction or hemorrhage. Vascular: There is atherosclerotic calcification of the internal carotid arteries. Skull: Normal. Negative for fracture or focal lesion. Sinuses/Orbits: No acute finding. Other: None. IMPRESSION: 1. No evidence for acute intracranial abnormality. 2. Atrophy and small vessel disease. 3. Prominent LATERAL and third ventricles may be related to central and cortical atrophy. However, normal pressure hydrocephalus is not excluded. Consider further evaluation with MRI. Electronically Signed   By: Norva Pavlov M.D.   On: 07/31/2020 16:34   MR BRAIN WO CONTRAST  Result Date: 07/31/2020 CLINICAL DATA:  Initial evaluation for normal pressure hydrocephalus. EXAM: MRI HEAD WITHOUT CONTRAST TECHNIQUE: Multiplanar, multiecho pulse sequences of the brain and surrounding structures were obtained without intravenous contrast. COMPARISON:  Prior CT from earlier the same day. FINDINGS: Brain: Diffuse prominence of the CSF containing spaces compatible with generalized cerebral atrophy. Patchy and confluent T2/FLAIR hyperintensity within the periventricular and deep white matter  both cerebral hemispheres most likely related chronic microvascular ischemic disease, moderate in nature. Patchy involvement of the pons noted. No convincing foci of diffusion abnormality to suggest acute or subacute ischemia are seen. Gray-white matter differentiation maintained. No encephalomalacia to suggest chronic cortical infarction. No foci of susceptibility artifact to suggest acute or chronic intracranial hemorrhage. Diffuse prominence of the lateral and third ventricles, with relatively  normal caliber of the fourth ventricle is seen. This is somewhat out of proportion to overlying cortical sulcation. Evans index measures 0.4. Findings suggestive of NPH. A portion of the periventricular T2/FLAIR signal abnormality about the lateral ventricles may in part be related to a degree of mild transependymal flow of CSF. No mass lesion, mass effect, or midline shift. No extra-axial fluid collection. Pituitary gland suprasellar region normal. Midline structures intact. Vascular: Major intracranial vascular flow voids are maintained. Dominant right vertebral artery noted. Skull and upper cervical spine: Craniocervical junction normal. Bone marrow signal intensity within normal limits. No scalp soft tissue abnormality. Sinuses/Orbits: Patient status post bilateral ocular lens replacement. Globes and orbital soft tissues demonstrate no acute finding. Paranasal sinuses are largely clear. No mastoid effusion. Inner ear structures grossly normal. Other: None. IMPRESSION: 1. Diffuse prominence of the lateral and third ventricles, somewhat out of proportion to cortical sulcation, with relatively normal caliber of the fourth ventricle. 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 Clinical correlation recommended. 2. Underlying age-related cerebral atrophy with moderate chronic microvascular ischemic disease. 3. No other acute intracranial abnormality. Electronically Signed   By: Rise Mu M.D.   On: 07/31/2020 23:45   DG  Chest Portable 1 View  Result Date: 08/08/2020 CLINICAL DATA:  Weakness EXAM: PORTABLE CHEST 1 VIEW COMPARISON:  July 31, 2020 FINDINGS: There is stable elevation of the right hemidiaphragm. There is mild atelectatic change in the left base. Lungs elsewhere are clear. Heart is upper normal in size with pulmonary vascularity normal. No adenopathy. No bone lesions. IMPRESSION: Mild left base atelectasis. No edema or airspace opacity. Stable cardiac silhouette. Stable elevation of the right hemidiaphragm. Aortic Atherosclerosis (ICD10-I70.0). Electronically Signed   By: Bretta Bang III M.D.   On: 08/08/2020 08:17   CT Renal Stone Study  Result Date: 08/08/2020 CLINICAL DATA:  Abdominal pain EXAM: CT ABDOMEN AND PELVIS WITHOUT CONTRAST TECHNIQUE: Multidetector CT imaging of the abdomen and pelvis was performed following the standard protocol without oral or IV contrast. COMPARISON:  None. FINDINGS: Lower chest: There is a moderate pericardial effusion. There are foci of coronary artery calcification. There is a left pleural effusion. There are areas of airspace consolidation in each lung base, somewhat more on the left than on the right. Hepatobiliary: No focal liver lesions are evident on this noncontrast enhanced study. The gallbladder wall does not appear appreciably thickened. There is no appreciable biliary duct dilatation. Pancreas: No pancreatic mass or inflammatory focus. Spleen: No splenic lesions are evident. Adrenals/Urinary Tract: Adrenals bilaterally appear unremarkable. Left kidney is somewhat atrophic. Apparent degree of compensatory hypertrophy right kidney. There is a mass arising from the lateral mid right kidney measuring 1.4 x 0.9 cm which has attenuation values higher than is expected with a cyst. There is a cyst arising from the posterior upper right kidney measuring 1.7 x 1.7 cm. There is a cyst arising from the upper pole of the left kidney measuring 2.6 x 2.4 cm. No appreciable  hydronephrosis on either side. There is a 1 mm calculus in the upper pole the right kidney. There is no evident ureteral calculus on either side. Urinary bladder is midline. Urinary bladder wall is borderline thickened. Stomach/Bowel: Most bowel loops are fluid-filled. There is wall thickening with adjacent mesenteric stranding extending from the cecum to the level of the splenic flexure. No pneumatosis evident in this area. No similar changes elsewhere involving bowel. No appreciable bowel obstruction. The terminal ileum appears unremarkable. No appreciable free air or portal venous air. Appendix  region appears unremarkable. Vascular/Lymphatic: No abdominal aortic aneurysm. There is extensive aortic and iliac artery atherosclerosis. There is apparent atherosclerotic plaque at the origins of the celiac and superior mesenteric arteries. No adenopathy is appreciable in the abdomen or pelvis. Reproductive: Uterus absent.  No adnexal masses are appreciable. Other: No abscess or ascites in the abdomen or pelvis. Musculoskeletal: Postoperative change noted in proximal right femur. Bones are osteoporotic. There is severe narrowing of each hip joint. There is marked lumbar levoscoliosis. No blastic or lytic bone lesions are evident. No intramuscular lesions are evident. Note that there is pelvic muscle atrophy bilaterally. IMPRESSION: 1. There is wall thickening and surrounding mesenteric inflammatory change extending from the cecum to the distal transverse colon. This appearance is consistent with colitis. This distribution of colitis raises concern for ischemic colitis. Note that there is extensive atherosclerotic changes noted at multiple sites. No bowel pneumatosis evident. 2. Most small and large bowel loops are fluid-filled which may be secondary to a degree of underlying enterocolitis of uncertain etiology. Note that there is no appreciable small bowel thickening. A degree of ischemic enteritis is a possibility in  this circumstance, however. 3. No bowel obstruction. No abscess in the abdomen or pelvis. No appendiceal region inflammation. 4. There is a mass arising from the lateral right kidney measuring 1.4 x 0.9 cm. This mass cannot be classified as a cyst. A small renal cell carcinoma could present in this manner. Further evaluation with pre and post contrast MRI should be considered. Pre and post contrast CT could alternatively be performed, but would likely be of decreased accuracy given lesion size. 5.  Bibasilar apparent pneumonia with left pleural effusion. 6.  Moderate pericardial effusion. 7. 1 mm calculus upper pole right kidney. No hydronephrosis or ureteral calculus on either side. 8. Question a degree of cystitis given mild thickening of the urinary bladder wall. 9.  Uterus absent. 10. Extensive osteoporosis. Arthropathy in the hips and lumbar spine region. Aortic Atherosclerosis (ICD10-I70.0). Electronically Signed   By: Bretta Bang III M.D.   On: 08/08/2020 09:52   CT Angio Abd/Pel W and/or Wo Contrast  Result Date: 08/08/2020 CLINICAL DATA:  Acute mesenteric ischemia Decreased responsiveness Hypoxic Hypotensive EXAM: CTA ABDOMEN AND PELVIS WITHOUT AND WITH CONTRAST TECHNIQUE: Multidetector CT imaging of the abdomen and pelvis was performed using the standard protocol during bolus administration of intravenous contrast. Multiplanar reconstructed images and MIPs were obtained and reviewed to evaluate the vascular anatomy. CONTRAST:  66mL OMNIPAQUE IOHEXOL 350 MG/ML SOLN COMPARISON:  Renal stone CT 08/08/2020 FINDINGS: VASCULAR Aorta: Diffuse atherosclerotic changes of the abdominal aorta without aneurysmal dilatation or flow-limiting stenosis. Linear low-density structure extending through the lumen of the proximal infrarenal abdominal aorta (image 88, series 4) may be remodeled plaque or non flow limiting dissection. Celiac: Severe stenosis at the origin with poststenotic dilatation measuring up to 10  mm. SMA: Patent without evidence of aneurysm, dissection, vasculitis or significant stenosis. Renals: No significant stenosis of the right main renal artery. There is severe stenosis of the proximal left main renal artery. IMA: Severe stenosis at the origin but otherwise patent. Inflow: Diffusely calcified common internal iliac arteries which remain patent without significant stenosis. Proximal Outflow: 50% stenosis of the left common femoral artery due to calcified plaque. No significant stenosis of the right common femoral artery. Veins: Hepatic, portal, superior mesenteric, and splenic veins are patent. Review of the MIP images confirms the above findings. NON-VASCULAR Lower chest: Mild bibasilar atelectasis. Emphysematous changes seen at the lung  bases. Minimal left pleural effusion. Moderate pericardial effusion is unchanged in size since prior study. Severe coronary artery calcifications. Hepatobiliary: No focal liver abnormality is seen. No gallstones, gallbladder wall thickening, or biliary dilatation. Pancreas: 1.2 cm low-density mass is seen in the body of the pancreas (image 60, series 6). No pancreatic duct dilatation. Spleen: Normal in size without focal abnormality. Adrenals/Urinary Tract: Adrenal glands normal in appearance. Asymmetric atrophy of the left kidney. Multiple bilateral simple cysts are present with the largest located at the upper pole of the left kidney measuring 2.8 cm. The subcentimeter renal lesions are too small to fully characterize. Diffuse bladder wall thickening likely due to under distension. Stomach/Bowel: No significant abnormality of the stomach or small bowel. No bowel dilatation to indicate ileus or obstruction. Diffuse thickening of the wall of the of the colon with adjacent fat stranding consistent with acute colitis. Appendix is not definitely identified. Lymphatic: No enlarged abdominal or pelvic lymph nodes. Reproductive: No adnexal abnormality is identified. There has  likely been prior hysterectomy. Other: Minimal free pelvic fluid. Musculoskeletal: Right femoral fixation hardware partially visualized. Advanced degenerative changes seen throughout the lumbar spine. Moderate left and mild right hip osteoarthrosis. Levoconvex scoliosis of the thoracolumbar spine. IMPRESSION: VASCULAR 1. 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 No significant narrowing of the superior mesenteric artery. Inferior mesenteric artery is patent. 2. No significant abnormality of the mesenteric veins. 3. 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 NON-VASCULAR 1. Diffuse thickening of the wall of the colon with mild adjacent fat stranding consistent with colitis, likely of inflammatory or infectious etiology. No abscess identified. No evidence of perforation. 2. Moderate pericardial effusion unchanged since prior study. 3. 1.2 cm low-density mass in the pancreatic body requires further evaluation with contrast enhanced MRI. Electronically Signed   By: Acquanetta Belling M.D.   On: 08/08/2020 13:31      Assessment/Plan 1.  Sepsis.  Etiology is unclear.  She is being treated with antibiotics and a sepsis work-up by the primary service 2.  Possible ischemic colitis.  Not currently complaining of any abdominal pain.  This may be a result of a low flow state and hypotension. The CT angiogram demonstrates what appears to be stenosis of the celiac artery but a widely patent SMA and the IMA is also patent but small as would be expected.  Her main vessels are patent that would supply the colon.  The celiac artery disease is not clinically relevant to her colon.  No intervention is required for this.  No further vascular work-up or evaluation is planned at this time.  If she develops symptoms from possible ischemic colitis, consider general surgery consult for colectomy although she would be a very poor candidate for this. 3.  Hypertension.  Came in  with hypotension and dehydration.  Blood pressure is currently stable.   Festus Barren, MD  08/08/2020 5:07 PM    This note was created with Dragon medical transcription system.  Any error is purely unintentional

## 2020-08-08 NOTE — Progress Notes (Signed)
Pharmacy Antibiotic Note  Andrea Barker is a 80 y.o. female admitted on 08/08/2020 with sepsis. Pt presented with AMS. Pt recently discharged from admission for UTI 2/28-3/3. Pt received ceftriaxone >> keflex x 7 days total therapy for UCx proteus mirabilis sensitive to cefazolin and ceftriaxone. PMH includes dementia, HTN, CKD, venous stasis dermatitis, chronic back pain, and chronic respiratory failure on 2L O2. CT concerning for colitis (no abscess), kidney mass, bibasilar apparent pneumonia with L pleural effusion, R kidney calculus (no hydronephrosis), and cystitis. LA 2.8>4.0, WBC 21.7, afebrile. Pharmacy has been consulted for cefepime dosing.  Day 1 abx  Plan: cefepime 2 g IV q24h  Monitor renal function  Height: 5\' 5"  (165.1 cm) Weight: 58 kg (127 lb 13.9 oz) IBW/kg (Calculated) : 57  Temp (24hrs), Avg:98 F (36.7 C), Min:97.6 F (36.4 C), Max:98.6 F (37 C)  Recent Labs  Lab 08/02/20 0655 08/08/20 0742 08/08/20 0951  WBC  --  21.7*  --   CREATININE 1.09* 1.46*  --   LATICACIDVEN  --  2.9* 2.8*    Estimated Creatinine Clearance: 28.6 mL/min (A) (by C-G formula based on SCr of 1.46 mg/dL (H)).    Allergies  Allergen Reactions  . Avelox [Moxifloxacin Hcl In Nacl] Swelling  . Calamine     RASH  . Clindamycin Diarrhea and Nausea Only  . Clindamycin/Lincomycin Nausea And Vomiting  . Doxepin     Other reaction(s): Diarrhea  . Fluorouracil     Other reaction(s): Rash  . Indomethacin Nausea And Vomiting  . Levaquin [Levofloxacin] Diarrhea  . Minocycline Diarrhea and Nausea Only  . Penicillins Swelling  . Sulfa Antibiotics Hives  . Vancomycin Swelling  . Latex Rash  . Neomycin-Bacitracin Zn-Polymyx Rash    Antimicrobials this admission: 3/8 cefepime >> 3/8 metronidazole x 1 dose  Microbiology results: 3/8 BCx: pending 3/8 UCx: pending 3/8 c diff PCR: pending  Thank you for allowing pharmacy to be a part of this patient's care.  5/8,  PharmD Pharmacy Resident  08/08/2020 2:08 PM

## 2020-08-08 NOTE — ED Provider Notes (Signed)
[facility] Emergency Department Provider Note   ____________________________________________   Event Date/Time   First MD Initiated Contact with Patient 08/08/20 [phone]     (approximate)  I have reviewed the triage vital signs and the nursing notes.   HISTORY  Chief Complaint Code Sepsis    HPI Andrea Barker is a 80 y.o. female with past medical history of dementia, hypertension, chronic kidney disease, venous stasis dermatitis, chronic back pain, and chronic respiratory failure on 2 L nasal cannula who presents to the ED for altered mental status.  Patient resides at [facility] and was reportedly awake and alert to her baseline at 6 AM this morning.  Her son then came to visit her and found her less responsive than usual later this morning.  She seemed slumped over to one side and when EMS arrived, initial blood pressure was in the 60s systolic.  She was borderline hypoxic on her usual 2 L nasal cannula, increased to 4 L with improvement.  Patient was given 500 cc IV bolus during transport, blood pressure and mental status seemed to improve afterwards.  Patient complains of "pain all over", denies any difficulty breathing.        Past Medical History:  Diagnosis Date  . Anxiety   . Arthritis   . GERD (gastroesophageal reflux disease)   . Hypertension   . Seasonal allergic rhinitis     Patient Active Problem List   Diagnosis Date Noted  . Altered mental status 07/31/2020  . Failure to thrive in adult 07/31/2020  . Essential hypertension 07/31/2020  . CKD (chronic kidney disease), stage III (HCC) 07/31/2020  . Debility 07/31/2020    Past Surgical History:  Procedure Laterality Date  . PARTIAL HYSTERECTOMY      Prior to Admission medications   Medication Sig Start Date End Date Taking? Authorizing Provider  acetaminophen (TYLENOL) 650 MG CR tablet Take 1,300 mg by mouth every 8 (eight) hours as needed for pain.    [provider]  amLODipine (NORVASC) 10 MG tablet Take 10 mg by mouth daily.    [provider]  Ascorbic Acid (VITAMIN C) 100 MG tablet Take 100 mg by mouth daily.    [provider]  cephALEXin (KEFLEX) 250 MG capsule Take 1 capsule (250 mg total) by mouth 4 (four) times daily. 08/03/20   Wouk, Wilfred Curtis, MD  cetirizine (ZYRTEC) 10 MG tablet Take 10 mg by mouth daily.    [provider]  diclofenac Sodium (VOLTAREN) 1 % GEL Apply 2 g topically in the morning, at noon, in the evening, and at bedtime. 05/15/20   [provider]  docusate sodium (COLACE) 100 MG capsule Take 200 mg by mouth daily.    [provider]  esomeprazole (NEXIUM) 20 MG capsule Take 20 mg by mouth daily at 12 noon.    [provider]  fentaNYL (DURAGESIC - DOSED MCG/HR) 100 MCG/HR Place 1 patch onto the skin every 3 (three) days.    [provider]  fluticasone (FLONASE) 50 MCG/ACT nasal spray Place into both nostrils daily.    [provider]  gabapentin (NEURONTIN) 300 MG capsule Take 300 mg by mouth at bedtime. 07/19/20   [provider]  hydrochlorothiazide (HYDRODIURIL) 12.5 MG tablet Take 12.5 mg by mouth daily. 07/31/20   [provider]  lidocaine (LIDODERM) 5 % Place 1 patch onto the skin daily. Remove & Discard patch within 12 hours or as directed by MD  [provider]  losartan (COZAAR) 100 MG tablet Take 100 mg by mouth daily. 07/31/20   [provider]  magnesium oxide (MAG-OX) 400 MG tablet Take 400 mg by mouth 2 (two) times daily.    [provider]  Multiple Vitamins-Minerals (MULTIVITAMIN WITH MINERALS) tablet Take 1 tablet by mouth daily.    [provider]  nystatin cream (MYCOSTATIN) Apply 1 application topically 2 (two) times daily. 07/04/20   [provider]  Oxycodone HCl 10 MG TABS Take 0.5 tablets (5 mg total) by mouth every 6 (six) hours as needed. 08/03/20   Wouk, Wilfred Curtis, MD   polyethylene glycol (MIRALAX / GLYCOLAX) 17 g packet Take 17 g by mouth daily as needed for mild constipation.    [provider]  promethazine (PHENERGAN) 25 MG tablet Take 25 mg by mouth every 6 (six) hours as needed for nausea or vomiting.    [provider]  sertraline (ZOLOFT) 50 MG tablet Take 50 mg by mouth daily.    [provider]    Allergies Avelox [moxifloxacin hcl in nacl], Calamine, Clindamycin, Clindamycin/lincomycin, Doxepin, Fluorouracil, Indomethacin, Levaquin [levofloxacin], Minocycline, Penicillins, Sulfa antibiotics, Vancomycin, Latex, and Neomycin-bacitracin zn-polymyx  No family history on file.  Social History Social History   Tobacco Use  . Smoking status: Former Games developer  . Smokeless tobacco: Never Used  Substance Use Topics  . Alcohol use: Yes    Comment: occaional  . Drug use: No    Review of Systems  Constitutional: No fever/chills.  Positive for diffuse pain. Eyes: No visual changes. ENT: No sore throat. Cardiovascular: Denies chest pain. Respiratory: Denies shortness of breath. Gastrointestinal: No abdominal pain.  No nausea, no vomiting.  No diarrhea.  No constipation. Genitourinary: Negative for dysuria. Musculoskeletal: Negative for back pain. Skin: Negative for rash. Neurological: Negative for headaches, focal weakness or numbness.  ____________________________________________   PHYSICAL EXAM:  VITAL SIGNS: ED Triage Vitals  Enc Vitals Group     BP 08/08/20 0742 (!) 86/57     Pulse Rate 08/08/20 0741 93     Resp 08/08/20 0741 20     Temp 08/08/20 0742 97.6 F (36.4 C)     Temp Source 08/08/20 0742 Axillary     SpO2 --      Weight 08/08/20 0738 127 lb 13.9 oz (58 kg)     Height 08/08/20 0738  (1.651 m)     Head Circumference --      [facility] Flow --      Pain Score --      Pain Loc --      Pain Edu? --      Excl. in GC? --     Constitutional: Alert and oriented to person, place, and time but not  situation. Eyes: Conjunctivae are normal.  Pupils equal round and reactive to light bilaterally. Head: Atraumatic. Nose: No congestion/rhinnorhea. Mouth/Throat: Mucous membranes are very dry. Neck: Normal ROM Cardiovascular: Normal rate, regular rhythm. Grossly normal heart sounds. Respiratory: Normal respiratory effort.  No retractions. Lungs CTAB. Gastrointestinal: Soft and nontender. No distention. Genitourinary: deferred Musculoskeletal: No lower extremity tenderness nor edema.  Venous stasis dermatitis noted with no erythema, warmth, tenderness, or purulence. Neurologic:  Normal speech and language.  Globally weak with no gross focal neurologic deficits appreciated. Skin:  Skin is warm, dry and intact. No rash noted. Psychiatric: Mood and affect are normal. Speech and behavior are normal.  ____________________________________________   LABS (all labs ordered are listed, but only abnormal  results are displayed)  Labs Reviewed  CBC WITH DIFFERENTIAL/PLATELET - Abnormal; Notable for the following components:      Result Value   WBC 21.7 (*)    Platelets 594 (*)    Neutro Abs 18.7 (*)    Abs Immature Granulocytes 0.16 (*)    All other components within normal limits  COMPREHENSIVE METABOLIC PANEL - Abnormal; Notable for the following components:   Glucose, Bld 144 (*)    BUN 35 (*)    Creatinine, Ser 1.46 (*)    Calcium 8.2 (*)    Albumin 2.6 (*)    GFR, Estimated 37 (*)    All other components within normal limits  URINALYSIS, COMPLETE (UACMP) WITH MICROSCOPIC - Abnormal; Notable for the following components:   Color, Urine YELLOW (*)    APPearance HAZY (*)    Ketones, ur 5 (*)    Protein, ur 30 (*)    Leukocytes,Ua TRACE (*)    All other components within normal limits  LACTIC ACID, PLASMA - Abnormal; Notable for the following components:   Lactic Acid, Venous 2.9 (*)    All other components within normal limits  LACTIC ACID, PLASMA - Abnormal; Notable for the  following components:   Lactic Acid, Venous 2.8 (*)    All other components within normal limits  RESP PANEL BY RT-PCR (FLU A&B, COVID) ARPGX2  CULTURE, BLOOD (ROUTINE X 2)  CULTURE, BLOOD (ROUTINE X 2)  URINE CULTURE  PROCALCITONIN  TROPONIN I (HIGH SENSITIVITY)  TROPONIN I (HIGH SENSITIVITY)   ____________________________________________  EKG  ED ECG REPORT I, Chesley Noon, the attending physician, personally viewed and interpreted this ECG.   Date: 08/08/2020  EKG Time: 7:40  Rate: 96  Rhythm: normal sinus rhythm, PVC's noted  Axis: RAD  Intervals:none  ST&T Change: None   PROCEDURES  Procedure(s) performed (including Critical Care):  .Critical Care Performed by: Chesley Noon, MD Authorized by: Chesley Noon, MD   Critical care provider statement:    Critical care time (minutes):  45   Critical care time was exclusive of:  Separately billable procedures and treating other patients and teaching time   Critical care was necessary to treat or prevent imminent or life-threatening deterioration of the following conditions:  Sepsis   Critical care was time spent personally by me on the following activities:  Discussions with consultants, evaluation of patient's response to treatment, examination of patient, ordering and performing treatments and interventions, ordering and review of laboratory studies, ordering and review of radiographic studies, pulse oximetry, re-evaluation of patient's condition, obtaining history from patient or surrogate and review of old charts   I assumed direction of critical care for this patient from another provider in my specialty: no     Care discussed with: admitting provider       ____________________________________________   INITIAL IMPRESSION / ASSESSMENT AND PLAN / ED COURSE       80 year old female with past medical history of dementia, hypertension, CKD, chronic back pain, and venous stasis dermatitis who presents to the  ED for altered mental status and decreased responsiveness noted this morning.  Patient initially hypotensive with EMS, received IV fluids with improvement but again developing recurrent hypotension here in the ED.  We will continue IV fluid hydration as patient clinically appears very dehydrated.  Given concern for sepsis, we will also start patient on broad-spectrum antibiotics, however she has allergic reaction to vancomycin.  We will check chest x-ray and UA, previous UTI should have responded well to  Keflex based off of culture data.  EKG shows no evidence of arrhythmia or ischemia, lab work is pending.  No focal neurologic deficits to suggest stroke or other intracranial process.  Chest x-ray reviewed by me and shows no infiltrate, edema, or effusion, UA shows possible infection but is not particularly impressive, we will send for culture.  Patient's blood pressure gradually improving following IV fluid resuscitation, we will trend lactic acid.  CT head was performed and negative for acute process, CT renal study performed to rule out ureterolithiasis.  There is no evidence of ureterolithiasis although patient has inflammatory changes around her colon consistent with colitis, would consider ischemic colitis per radiology.  This finding was discussed with Dr. Wyn Quaker of vascular surgery, who states it is likely due to low flow state and her hypotension from sepsis.  He recommends further resuscitation as well as CTA to rule out acute occlusion, but low suspicion for this for now and we will hold off on anticoagulation.  Case discussed with hospitalist for admission.      ____________________________________________   FINAL CLINICAL IMPRESSION(S) / ED DIAGNOSES  Final diagnoses:  Sepsis, due to unspecified organism, unspecified whether acute organ dysfunction present [facility])  Community acquired pneumonia, unspecified laterality  Ischemic colitis [facility])     ED Discharge Orders    None       Note:   This document was prepared using Dragon voice recognition software and may include unintentional dictation errors.   Chesley Noon, MD 08/08/20 1044

## 2020-08-08 NOTE — ED Triage Notes (Signed)
[facility] for sepsis alert. Ems report pt baseline 2L [place], alert and oriented x2. Recently discharged for uti. Called out for hypoxia. 96% on 4L.  Pt in NAD 20G to right hand and left FA Pt oriented to person, time and place. Disoriented to situation.  Reports pain all over.  LKW 0600 this am

## 2020-08-08 NOTE — ED Notes (Signed)
Pt back from CT

## 2020-08-08 NOTE — ED Notes (Addendum)
dificulty obtaining 2nd set of cultures, lab contacted. Dr Larinda Buttery notified

## 2020-08-08 NOTE — Consult Note (Signed)
PHARMACY -  BRIEF ANTIBIOTIC NOTE   Pharmacy has received consult(s) for Cefepime from an ED provider.    The patient's profile has been reviewed for ht/wt/allergies/indication/available labs.    One time order(s) placed for Cefepime 2g IV x 1  Further antibiotics/pharmacy consults should be ordered by admitting physician if indicated.                       Thank you,  Albina Billet, PharmD, BCPS Clinical Pharmacist 08/08/2020 8:02 AM

## 2020-08-08 NOTE — ED Notes (Signed)
Pt changed, purewick placed on pt

## 2020-08-09 DIAGNOSIS — A419 Sepsis, unspecified organism: Secondary | ICD-10-CM | POA: Diagnosis not present

## 2020-08-09 DIAGNOSIS — Z881 Allergy status to other antibiotic agents status: Secondary | ICD-10-CM

## 2020-08-09 DIAGNOSIS — E860 Dehydration: Secondary | ICD-10-CM

## 2020-08-09 DIAGNOSIS — G9340 Encephalopathy, unspecified: Secondary | ICD-10-CM

## 2020-08-09 DIAGNOSIS — A0472 Enterocolitis due to Clostridium difficile, not specified as recurrent: Secondary | ICD-10-CM

## 2020-08-09 DIAGNOSIS — I2510 Atherosclerotic heart disease of native coronary artery without angina pectoris: Secondary | ICD-10-CM

## 2020-08-09 DIAGNOSIS — N1831 Chronic kidney disease, stage 3a: Secondary | ICD-10-CM | POA: Diagnosis not present

## 2020-08-09 DIAGNOSIS — R6520 Severe sepsis without septic shock: Secondary | ICD-10-CM | POA: Diagnosis present

## 2020-08-09 DIAGNOSIS — R627 Adult failure to thrive: Secondary | ICD-10-CM | POA: Diagnosis not present

## 2020-08-09 DIAGNOSIS — R532 Functional quadriplegia: Secondary | ICD-10-CM

## 2020-08-09 LAB — MRSA PCR SCREENING: MRSA by PCR: POSITIVE — AB

## 2020-08-09 LAB — URINE CULTURE: Culture: NO GROWTH

## 2020-08-09 LAB — CBC
HCT: 35.8 % — ABNORMAL LOW (ref 36.0–46.0)
Hemoglobin: 11.7 g/dL — ABNORMAL LOW (ref 12.0–15.0)
MCH: 29.6 pg (ref 26.0–34.0)
MCHC: 32.7 g/dL (ref 30.0–36.0)
MCV: 90.6 fL (ref 80.0–100.0)
Platelets: 570 10*3/uL — ABNORMAL HIGH (ref 150–400)
RBC: 3.95 MIL/uL (ref 3.87–5.11)
RDW: 13.8 % (ref 11.5–15.5)
WBC: 25.9 10*3/uL — ABNORMAL HIGH (ref 4.0–10.5)
nRBC: 0.1 % (ref 0.0–0.2)

## 2020-08-09 LAB — BASIC METABOLIC PANEL
Anion gap: 6 (ref 5–15)
BUN: 27 mg/dL — ABNORMAL HIGH (ref 8–23)
CO2: 26 mmol/L (ref 22–32)
Calcium: 7.7 mg/dL — ABNORMAL LOW (ref 8.9–10.3)
Chloride: 110 mmol/L (ref 98–111)
Creatinine, Ser: 1.02 mg/dL — ABNORMAL HIGH (ref 0.44–1.00)
GFR, Estimated: 56 mL/min — ABNORMAL LOW (ref >60)
Glucose, Bld: 121 mg/dL — ABNORMAL HIGH (ref 70–99)
Potassium: 3.1 mmol/L — ABNORMAL LOW (ref 3.5–5.1)
Sodium: 142 mmol/L (ref 135–145)

## 2020-08-09 LAB — C DIFFICILE QUICK SCREEN W PCR REFLEX
C Diff antigen: POSITIVE — AB
C Diff interpretation: DETECTED
C Diff toxin: POSITIVE — AB

## 2020-08-09 LAB — PROCALCITONIN: Procalcitonin: <0.1 ng/mL

## 2020-08-09 LAB — MAGNESIUM: Magnesium: 1.7 mg/dL (ref 1.7–2.4)

## 2020-08-09 LAB — PROTIME-INR
INR: 1.3 — ABNORMAL HIGH (ref 0.8–1.2)
Prothrombin Time: 16.1 seconds — ABNORMAL HIGH (ref 11.4–15.2)

## 2020-08-09 LAB — CORTISOL-AM, BLOOD: Cortisol - AM: 23.7 ug/dL — ABNORMAL HIGH (ref 6.7–22.6)

## 2020-08-09 MED ORDER — MUPIROCIN 2 % EX OINT
1.0000 "application " | TOPICAL_OINTMENT | Freq: Two times a day (BID) | CUTANEOUS | Status: AC
  Administered 2020-08-09 – 2020-08-13 (×10): 1 via NASAL
  Filled 2020-08-09 (×2): qty 22

## 2020-08-09 MED ORDER — SODIUM CHLORIDE 0.9 % IV SOLN
2.0000 g | Freq: Two times a day (BID) | INTRAVENOUS | Status: DC
  Filled 2020-08-09 (×2): qty 2

## 2020-08-09 MED ORDER — FIDAXOMICIN 200 MG PO TABS
200.0000 mg | ORAL_TABLET | Freq: Two times a day (BID) | ORAL | Status: DC
  Administered 2020-08-09 – 2020-08-14 (×11): 200 mg via ORAL
  Filled 2020-08-09 (×13): qty 1

## 2020-08-09 MED ORDER — POTASSIUM CHLORIDE 10 MEQ/100ML IV SOLN
10.0000 meq | INTRAVENOUS | Status: AC
  Administered 2020-08-09 (×3): 10 meq via INTRAVENOUS
  Filled 2020-08-09 (×3): qty 100

## 2020-08-09 MED ORDER — MAGNESIUM SULFATE 2 GM/50ML IV SOLN
2.0000 g | Freq: Once | INTRAVENOUS | Status: AC
  Administered 2020-08-09: 2 g via INTRAVENOUS
  Filled 2020-08-09: qty 50

## 2020-08-09 MED ORDER — METRONIDAZOLE IN NACL 5-0.79 MG/ML-% IV SOLN
500.0000 mg | Freq: Three times a day (TID) | INTRAVENOUS | Status: DC
  Administered 2020-08-09 – 2020-08-10 (×3): 500 mg via INTRAVENOUS
  Filled 2020-08-09 (×6): qty 100

## 2020-08-09 MED ORDER — ENOXAPARIN SODIUM 40 MG/0.4ML ~~LOC~~ SOLN
40.0000 mg | SUBCUTANEOUS | Status: DC
  Administered 2020-08-09 – 2020-08-13 (×5): 40 mg via SUBCUTANEOUS
  Filled 2020-08-09 (×5): qty 0.4

## 2020-08-09 NOTE — Consult Note (Signed)
Consultation Note Date: 08/09/2020   Patient Name: Andrea Barker  DOB: Aug 13, 1940  MRN: [id number]  Age / Sex: 80 y.o., female  PCP: [facility] Referring Physician: Enedina Finner, MD  Reason for Consultation: Establishing goals of care  HPI/Patient Profile: 80 y.o. female  with past medical history of HTN, chronic venous stasis, GERD, dementia, chronic respiratory failure on 2 L of oxygen, bedbound status, CKD, chronic back pain, and recent hospitalization for UTI (07/31/20-3/3-22) admitted on 08/08/2020 with AMS.  Required increase in oxygen by EMS. Hypotensive with EMS and in ED but improvement in BP with fluid boluses. Found to have elevated lactic acid and leukocytosis. Imaging suggesting of bowel ischemia vs enterocolitis. Per vascular surgery, no further vascular work up or evaluation is planned at this time. Found to be positive for c diff. Son requests PMT evaluation to assist with goals of care.   Clinical Assessment and Goals of Care: I have reviewed medical records including EPIC notes, labs and imaging, received report from RN, assessed the patient and then spoke with patient's son, Tawanna Cooler, and DIL Jasmine December,  to discuss diagnosis prognosis, GOC, EOL wishes, disposition and options.  I introduced Palliative Medicine as specialized medical care for people living with serious illness. It focuses on providing relief from the symptoms and stress of a serious illness. The goal is to improve quality of life for both the patient and the family.  As far as functional and nutritional status, patient is essentially bed bound and depends on others for daily care needs. She is unable to feed herself. Family tells me she had a fall in 2015 with a fractured femur and never really returned to baseline. They tell me about her progressive dementia, increased hallucinations. They tell me of a drastic decline since Christmas.    We discussed patient's  current illness and what it means in the larger context of patient's on-going co-morbidities.  Natural disease trajectory and expectations at EOL were discussed. We discussed her dementia - chronic, progressive nature of it. We discuss her complications and frequent infections. Discussed her bed bound status and dependence on others for nourishment needs.   I attempted to elicit values and goals of care important to the patient.  They shares their desire to focus on her comfort/quality of life.   Discussed with family the importance of continued conversation with family and the medical providers regarding overall plan of care and treatment options, ensuring decisions are within the context of the patient's values and GOCs.    Hospice and Palliative Care services outpatient were explained and offered. Discussed philosophy and type of support provided by hospice care. Family requests involving hospice in patient's care at SNF.   Questions and concerns were addressed. The family was encouraged to call with questions or concerns.   Unable to complete MOST as family not at bedside and technology insufficient to sign remotely. Can be addressed with hospice outpatient.   Primary Decision Maker NEXT OF KIN - son Burt Knack (patient with dementia) joined by his wife Theodis Shove    SUMMARY OF RECOMMENDATIONS   - hospice to follow at SNF - Unable to complete MOST as family not at bedside and technology insufficient to sign remotely. Can be addressed with hospice outpatient.  - continue current care until stable for discharge  Code Status/Advance Care Planning:  DNR  Prognosis:   < 6 months  Discharge Planning: Skilled Nursing Facility with Hospice      Primary Diagnoses: Present on Admission: .  Sepsis (HCC) . Altered mental status . CKD (chronic kidney disease), stage III (HCC) . Debility . Failure to thrive in adult . Essential hypertension . Pneumonia . Ischemic colitis (HCC) .  Functional quadriplegia (HCC)   I have reviewed the medical record, interviewed the patient and family, and examined the patient. The following aspects are pertinent.  Past Medical History:  Diagnosis Date  . Anxiety   . Arthritis   . GERD (gastroesophageal reflux disease)   . Hypertension   . Seasonal allergic rhinitis    Social History   Socioeconomic History  . Marital status: Divorced    Spouse name: Not on file  . Number of children: Not on file  . Years of education: Not on file  . Highest education level: Not on file  Occupational History  . Not on file  Tobacco Use  . Smoking status: Former Games developer  . Smokeless tobacco: Never Used  Substance and Sexual Activity  . Alcohol use: Yes    Comment: occaional  . Drug use: No  . Sexual activity: Not on file  Other Topics Concern  . Not on file  Social History Narrative  . Not on file   Social Determinants of Health   Financial Resource Strain: Not on file  Food Insecurity: Not on file  Transportation Needs: Not on file  Physical Activity: Not on file  Stress: Not on file  Social Connections: Not on file   No family history on file. Scheduled Meds: . Chlorhexidine Gluconate Cloth  6 each Topical Daily  . enoxaparin (LOVENOX) injection  40 mg Subcutaneous Q24H  . fidaxomicin  200 mg Oral BID  . mupirocin ointment  1 application Nasal BID  . pantoprazole (PROTONIX) IV  40 mg Intravenous Q24H   Continuous Infusions: . sodium chloride Stopped (08/09/20 0922)  . lactated ringers 75 mL/hr at 08/09/20 1005  . magnesium sulfate bolus IVPB    . metronidazole 500 mg (08/09/20 1212)  . norepinephrine (LEVOPHED) Adult infusion Stopped (08/08/20 1016)   PRN Meds:.acetaminophen, ondansetron **OR** ondansetron (ZOFRAN) IV Allergies  Allergen Reactions  . Avelox [Moxifloxacin Hcl In Nacl] Swelling  . Calamine     RASH  . Clindamycin Diarrhea and Nausea Only  . Clindamycin/Lincomycin Nausea And Vomiting  . Doxepin      Other reaction(s): Diarrhea  . Fluorouracil     Other reaction(s): Rash  . Indomethacin Nausea And Vomiting  . Levaquin [Levofloxacin] Diarrhea  . Minocycline Diarrhea and Nausea Only  . Penicillins Swelling  . Sulfa Antibiotics Hives  . Vancomycin Swelling  . Latex Rash  . Neomycin-Bacitracin Zn-Polymyx Rash   Review of Systems  Unable to perform ROS: Dementia  Denies pain, only complains of feeling cold and tired. She reports she is hungry.   Physical Exam Constitutional:      General: She is not in acute distress.    Comments: Sleeping but wakes easily to voice  Cardiovascular:     Rate and Rhythm: Normal rate and regular rhythm.  Pulmonary:     Effort: Pulmonary effort is normal. No respiratory distress.  Skin:    General: Skin is warm and dry.  Neurological:     Mental Status: She is oriented to person, place, and time.     Comments: Knows self, time, and place - but does not fully understand situation, easily confused and forgetful     Vital Signs: BP (!) 141/69   Pulse 91   Temp 98.7 F (37.1 C) (Oral)   Resp  15   Ht 5\' 5"  (1.651 m)   Wt 58 kg   SpO2 97%   BMI 21.28 kg/m  Pain Scale: 0-10   Pain Score: 0-No pain   SpO2: SpO2: 97 % O2 Device:SpO2: 97 % O2 Flow Rate: .O2 Flow Rate (L/min): 2 L/min  IO: Intake/output summary:   Intake/Output Summary (Last 24 hours) at 08/09/2020 1218 Last data filed at 08/09/2020 1000 Gross per 24 hour  Intake 2698.74 ml  Output 515 ml  Net 2183.74 ml    LBM: Last BM Date:  (pta) Baseline Weight: Weight: 58 kg Most recent weight: Weight: 58 kg     Palliative Assessment/Data: PPS 30%    Time Total: 80 minutes Greater than 50%  of this time was spent counseling and coordinating care related to the above assessment and plan.  10/09/2020, DNP, AGNP-C Palliative Medicine Team [phone] Pager: [phone]

## 2020-08-09 NOTE — Progress Notes (Signed)
[facility]  - [place] [facility]   PATIENT NAME: Andrea Barker    MR#:  [id number]  DATE OF BIRTH:  07-21-1940  SUBJECTIVE:   Patient more awake and alert. Did answer a few questions appropriately. No family in the room. [facility] was [facility] with hypotension admitted with sepsis. Workup showed colitis. Patient herself denies any abdominal pain. Earlier she said she wants to drink something. Currently receiving IV fluids. No other issues per RN. She did have runny stools last night. Stool tested positive for C. difficile. REVIEW OF SYSTEMS:   Review of Systems  Constitutional: Negative for chills, fever and weight loss.  HENT: Negative for ear discharge, ear pain and nosebleeds.   Eyes: Negative for blurred vision, pain and discharge.  Respiratory: Negative for sputum production, shortness of breath, wheezing and stridor.   Cardiovascular: Negative for chest pain, palpitations, orthopnea and PND.  Gastrointestinal: Positive for diarrhea. Negative for abdominal pain, nausea and vomiting.  Genitourinary: Negative for frequency and urgency.  Musculoskeletal: Negative for back pain and joint pain.  Neurological: Positive for weakness. Negative for sensory change, speech change and focal weakness.  Psychiatric/Behavioral: Negative for depression and hallucinations. The patient is not nervous/anxious.    Tolerating Diet: Tolerating PT:   DRUG ALLERGIES:   Allergies  Allergen Reactions  . Avelox [Moxifloxacin Hcl In Nacl] Swelling  . Calamine     RASH  . Clindamycin Diarrhea and Nausea Only  . Clindamycin/Lincomycin Nausea And Vomiting  . Doxepin     Other reaction(s): Diarrhea  . Fluorouracil     Other reaction(s): Rash  . Indomethacin Nausea And Vomiting  . Levaquin [Levofloxacin] Diarrhea  . Minocycline Diarrhea and Nausea Only  . Penicillins Swelling  . Sulfa Antibiotics Hives  . Vancomycin Swelling  . Latex Rash  .  Neomycin-Bacitracin Zn-Polymyx Rash    VITALS:  Blood pressure (!) 141/69, pulse 91, temperature 98.7 F (37.1 C), temperature source Oral, resp. rate 15, height 5\' 5"  (1.651 m), weight 58 kg, SpO2 97 %.  PHYSICAL EXAMINATION:   Physical Exam appears weak and deconditioned GENERAL:  80 y.o.-year-old patient lying in the bed with no acute distress.  LUNGS: Normal breath sounds bilaterally, no wheezing, rales, rhonchi. No use of accessory muscles of respiration.  CARDIOVASCULAR: S1, S2 normal. No murmurs, rubs, or gallops.  ABDOMEN: Soft, nontender, mildly distended. Bowel sounds present. EXTREMITIES: No cyanosis, clubbing or edema b/l.    NEUROLOGIC: nonfocal. Moves all extremities well. PSYCHIATRIC:  patient is alert and awake SKIN: No obvious rash, lesion, or ulcer.   LABORATORY PANEL:  CBC Recent Labs  Lab 08/09/20 0339  WBC 25.9*  HGB 11.7*  HCT 35.8*  PLT 570*    Chemistries  Recent Labs  Lab 08/08/20 0742 08/09/20 0339  NA 145 142  K 3.5 3.1*  CL 109 110  CO2 23 26  GLUCOSE 144* 121*  BUN 35* 27*  CREATININE 1.46* 1.02*  CALCIUM 8.2* 7.7*  MG  --  1.7  AST 20  --   ALT 16  --   ALKPHOS 69  --   BILITOT 0.7  --    Cardiac Enzymes No results for input(s): TROPONINI in the last 168 hours. RADIOLOGY:  CT Head Wo Contrast  Result Date: 08/08/2020 CLINICAL DATA:  Mental status change.  Sepsis alert. EXAM: CT HEAD WITHOUT CONTRAST TECHNIQUE: Contiguous axial images were obtained from the base of the skull through the vertex without intravenous contrast. COMPARISON:  CT head 07/31/2020 FINDINGS: Brain: Generalized atrophy unchanged. White matter hypodensity bilaterally, unchanged. No acute infarct, hemorrhage, mass.  No interval change. Vascular: Negative for hyperdense vessel Skull: Negative Sinuses/Orbits: Paranasal sinuses clear. Bilateral cataract extraction. Other: None IMPRESSION: No acute abnormality no interval change Atrophy and chronic microvascular  ischemic change in the white matter. Electronically Signed   By: Marlan Palau M.D.   On: 08/08/2020 09:40   DG Chest Portable 1 View  Result Date: 08/08/2020 CLINICAL DATA:  Weakness EXAM: PORTABLE CHEST 1 VIEW COMPARISON:  July 31, 2020 FINDINGS: There is stable elevation of the right hemidiaphragm. There is mild atelectatic change in the left base. Lungs elsewhere are clear. Heart is upper normal in size with pulmonary vascularity normal. No adenopathy. No bone lesions. IMPRESSION: Mild left base atelectasis. No edema or airspace opacity. Stable cardiac silhouette. Stable elevation of the right hemidiaphragm. Aortic Atherosclerosis (ICD10-I70.0). Electronically Signed   By: Bretta Bang III M.D.   On: 08/08/2020 08:17   CT Renal Stone Study  Result Date: 08/08/2020 CLINICAL DATA:  Abdominal pain EXAM: CT ABDOMEN AND PELVIS WITHOUT CONTRAST TECHNIQUE: Multidetector CT imaging of the abdomen and pelvis was performed following the standard protocol without oral or IV contrast. COMPARISON:  None. FINDINGS: Lower chest: There is a moderate pericardial effusion. There are foci of coronary artery calcification. There is a left pleural effusion. There are areas of airspace consolidation in each lung base, somewhat more on the left than on the right. Hepatobiliary: No focal liver lesions are evident on this noncontrast enhanced study. The gallbladder wall does not appear appreciably thickened. There is no appreciable biliary duct dilatation. Pancreas: No pancreatic mass or inflammatory focus. Spleen: No splenic lesions are evident. Adrenals/Urinary Tract: Adrenals bilaterally appear unremarkable. Left kidney is somewhat atrophic. Apparent degree of compensatory hypertrophy right kidney. There is a mass arising from the lateral mid right kidney measuring 1.4 x 0.9 cm which has attenuation values higher than is expected with a cyst. There is a cyst arising from the posterior upper right kidney measuring 1.7  x 1.7 cm. There is a cyst arising from the upper pole of the left kidney measuring 2.6 x 2.4 cm. No appreciable hydronephrosis on either side. There is a 1 mm calculus in the upper pole the right kidney. There is no evident ureteral calculus on either side. Urinary bladder is midline. Urinary bladder wall is borderline thickened. Stomach/Bowel: Most bowel loops are fluid-filled. There is wall thickening with adjacent mesenteric stranding extending from the cecum to the level of the splenic flexure. No pneumatosis evident in this area. No similar changes elsewhere involving bowel. No appreciable bowel obstruction. The terminal ileum appears unremarkable. No appreciable free air or portal venous air. Appendix region appears unremarkable. Vascular/Lymphatic: No abdominal aortic aneurysm. There is extensive aortic and iliac artery atherosclerosis. There is apparent atherosclerotic plaque at the origins of the celiac and superior mesenteric arteries. No adenopathy is appreciable in the abdomen or pelvis. Reproductive: Uterus absent.  No adnexal masses are appreciable. Other: No abscess or ascites in the abdomen or pelvis. Musculoskeletal: Postoperative change noted in proximal right femur. Bones are osteoporotic. There is severe narrowing of each hip joint. There is marked lumbar levoscoliosis. No blastic or lytic bone lesions are evident. No intramuscular lesions are evident. Note that there is pelvic muscle atrophy bilaterally. IMPRESSION: 1. There is wall thickening and surrounding mesenteric inflammatory change extending from the cecum to the distal transverse colon. This appearance is consistent with colitis. This distribution  of colitis raises concern for ischemic colitis. Note that there is extensive atherosclerotic changes noted at multiple sites. No bowel pneumatosis evident. 2. Most small and large bowel loops are fluid-filled which may be secondary to a degree of underlying enterocolitis of uncertain etiology.  Note that there is no appreciable small bowel thickening. A degree of ischemic enteritis is a possibility in this circumstance, however. 3. No bowel obstruction. No abscess in the abdomen or pelvis. No appendiceal region inflammation. 4. There is a mass arising from the lateral right kidney measuring 1.4 x 0.9 cm. This mass cannot be classified as a cyst. A small renal cell carcinoma could present in this manner. Further evaluation with pre and post contrast MRI should be considered. Pre and post contrast CT could alternatively be performed, but would likely be of decreased accuracy given lesion size. 5.  Bibasilar apparent pneumonia with left pleural effusion. 6.  Moderate pericardial effusion. 7. 1 mm calculus upper pole right kidney. No hydronephrosis or ureteral calculus on either side. 8. Question a degree of cystitis given mild thickening of the urinary bladder wall. 9.  Uterus absent. 10. Extensive osteoporosis. Arthropathy in the hips and lumbar spine region. Aortic Atherosclerosis (ICD10-I70.0). Electronically Signed   By: Bretta Bang III M.D.   On: 08/08/2020 09:52   CT Angio Abd/Pel W and/or Wo Contrast  Result Date: 08/08/2020 CLINICAL DATA:  Acute mesenteric ischemia Decreased responsiveness Hypoxic Hypotensive EXAM: CTA ABDOMEN AND PELVIS WITHOUT AND WITH CONTRAST TECHNIQUE: Multidetector CT imaging of the abdomen and pelvis was performed using the standard protocol during bolus administration of intravenous contrast. Multiplanar reconstructed images and MIPs were obtained and reviewed to evaluate the vascular anatomy. CONTRAST:  75mL OMNIPAQUE IOHEXOL 350 MG/ML SOLN COMPARISON:  Renal stone CT 08/08/2020 FINDINGS: VASCULAR Aorta: Diffuse atherosclerotic changes of the abdominal aorta without aneurysmal dilatation or flow-limiting stenosis. Linear low-density structure extending through the lumen of the proximal infrarenal abdominal aorta (image 88, series 4) may be remodeled plaque or non  flow limiting dissection. Celiac: Severe stenosis at the origin with poststenotic dilatation measuring up to 10 mm. SMA: Patent without evidence of aneurysm, dissection, vasculitis or significant stenosis. Renals: No significant stenosis of the right main renal artery. There is severe stenosis of the proximal left main renal artery. IMA: Severe stenosis at the origin but otherwise patent. Inflow: Diffusely calcified common internal iliac arteries which remain patent without significant stenosis. Proximal Outflow: 50% stenosis of the left common femoral artery due to calcified plaque. No significant stenosis of the right common femoral artery. Veins: Hepatic, portal, superior mesenteric, and splenic veins are patent. Review of the MIP images confirms the above findings. NON-VASCULAR Lower chest: Mild bibasilar atelectasis. Emphysematous changes seen at the lung bases. Minimal left pleural effusion. Moderate pericardial effusion is unchanged in size since prior study. Severe coronary artery calcifications. Hepatobiliary: No focal liver abnormality is seen. No gallstones, gallbladder wall thickening, or biliary dilatation. Pancreas: 1.2 cm low-density mass is seen in the body of the pancreas (image 60, series 6). No pancreatic duct dilatation. Spleen: Normal in size without focal abnormality. Adrenals/Urinary Tract: Adrenal glands normal in appearance. Asymmetric atrophy of the left kidney. Multiple bilateral simple cysts are present with the largest located at the upper pole of the left kidney measuring 2.8 cm. The subcentimeter renal lesions are too small to fully characterize. Diffuse bladder wall thickening likely due to under distension. Stomach/Bowel: No significant abnormality of the stomach or small bowel. No bowel dilatation to indicate ileus or  obstruction. Diffuse thickening of the wall of the of the colon with adjacent fat stranding consistent with acute colitis. Appendix is not definitely identified.  Lymphatic: No enlarged abdominal or pelvic lymph nodes. Reproductive: No adnexal abnormality is identified. There has likely been prior hysterectomy. Other: Minimal free pelvic fluid. Musculoskeletal: Right femoral fixation hardware partially visualized. Advanced degenerative changes seen throughout the lumbar spine. Moderate left and mild right hip osteoarthrosis. Levoconvex scoliosis of the thoracolumbar spine. IMPRESSION: VASCULAR 1. 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 No significant narrowing of the superior mesenteric artery. Inferior mesenteric artery is patent. 2. No significant abnormality of the mesenteric veins. 3. 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 NON-VASCULAR 1. Diffuse thickening of the wall of the colon with mild adjacent fat stranding consistent with colitis, likely of inflammatory or infectious etiology. No abscess identified. No evidence of perforation. 2. Moderate pericardial effusion unchanged since prior study. 3. 1.2 cm low-density mass in the pancreatic body requires further evaluation with contrast enhanced MRI. Electronically Signed   By: Acquanetta Belling M.D.   On: 08/08/2020 13:31   ASSESSMENT AND PLAN:   Andrea Barker is a 80 y.o. female with medical history significant for recent hospitalization for UTI, hypertension, chronic venous stasis GERD, dementia, chronic respiratory failure on 2 L of oxygen bedbound status who was sent to the patient for evaluation for possible sepsis.  Per EMS patient was hypoxic and her oxygen had to be increased to 4 L to maintain pulse oximetry greater than 92%.   Severe Sepsis (POA)/acute renal failure.  Clostridium difficile colitis --As evidenced by hypotension requiring IV fluid resuscitation, marked leukocytosis as well as elevated lactic acid level --Source of sepsis appears to be possible Clostridium difficile colitis (ischemic to rule out C. difficile  colitis) --patient was recently hospitalized and discharged on March 3 for UTI and urine cultures yielded Proteus mirabilis.  She was discharged back to the skilled nursing facility on Keflex to complete a 1 week course of therapy --Continue IV fluid resuscitation --clinically does not have pneumonia. Discontinue IV cephalosporin  -- infectious disease consultation-- IV Flagyl and Dificid (some allergy with vancomycin) -- clear liquid diet-- advance as tolerated  Ischemic colitis Noted on imaging studies -On physical exam patient is non tender -Findings may be secondary to low flow state from hypotension -- patient was seen by vascular surgery Dr. Wyn Quaker. She has celiac vessel stenosis which likely not contributing to any of the symptoms. No vascular procedure recommended.  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  acute renal failure superimposed on CKD stage IIIA mild dehydration -Secondary to poor oral intake and concomitant diuretic therapy -Patient with dry mucous membranes Baseline serum creatinine is about 1.02 on admission it is 1.46 --creat down to 1.02   Palliative care consultation placed. They are  to meet with family  DVT prophylaxis: Lovenox Code Status: DO NOT RESUSCITATE prior to admission Family Communication:  left message for son Burt Knack  disposition Plan: [facility] called: None Status: inpatient PLevel of care: Stepdown Status is: Inpatient  Remains inpatient appropriate because:Hemodynamically unstable   Dispo: The patient is from:  SNF              Anticipated d/c is to: SNF               Patient currently is not medically stable to d/c.   Difficult to place patient No  Patient admitted with severe sepsis and acute renal failure secondary to C. diff colitis. ID consultation placed. Continue current medical management and likely discharge once hemodynamically stable in 2 to 3 days.       TOTAL TIME TAKING CARE OF THIS PATIENT: 30 minutes.  >50% time spent on counselling and coordination of care  Note: This dictation was prepared with Dragon dictation along with smaller phrase technology. Any transcriptional errors that result from this process are unintentional.  Enedina Finner M.D    [facility]   CC: Primary care physician; [facility], Doctors MakingPatient ID: Andrea Barker, female   DOB: 01-27-1941, 80 y.o.   MRN: [id number]

## 2020-08-09 NOTE — Progress Notes (Signed)
PHARMACY CONSULT NOTE  Pharmacy Consult for Electrolyte Monitoring and Replacement   Recent Labs: Potassium (mmol/L)  Date Value  08/09/2020 3.1 (L)   Magnesium (mg/dL)  Date Value  81/03/3158 2.3   Calcium (mg/dL)  Date Value  45/85/9292 7.7 (L)   Albumin (g/dL)  Date Value  44/62/8638 2.6 (L)   Phosphorus (mg/dL)  Date Value  17/71/1657 3.6   Sodium (mmol/L)  Date Value  08/09/2020 142   Corrected Ca: 8.8 mg/dL  Assessment: 80 y.o. female admitted on 08/08/2020 with sepsis. Pt presented with AMS. Pt recently discharged from admission for UTI 2/28-3/3. PMH includes dementia, HTN, CKD, venous stasis dermatitis, chronic back pain, and chronic respiratory failure on 2L O2.  MIVF: lactated ringers at 75 mL/hr  Goal of Therapy:  Electrolytes WNL  Plan:   10 mEq IV KCl x 3  2 grams IV magnesium sulfate x 1  Re-check electrolytes in am  Lowella Bandy ,PharmD Clinical Pharmacist 08/09/2020 7:31 AM

## 2020-08-09 NOTE — Progress Notes (Signed)
Palliative:  Patient assessed, chart reviewed. Spoke with RN. Spoke briefly with son - he is unable to have goals of care conversation at this time - will call me back later today to set up meeting for this afternoon or tomorrow.   Gerlean Ren, DNP, AGNP-C Palliative Medicine Team Team Phone # [phone]  Pager # [phone]  NO CHARGE

## 2020-08-09 NOTE — Consult Note (Signed)
NAME: Andrea Barker  DOB: 1940/12/24  MRN: [id number]  Date/Time: 08/09/2020 3:34 PM  REQUESTING PROVIDER: Dr.PAtel Subjective:  REASON FOR CONSULT: cdiff colitis ?pt is a  Limited historian. Chart reviewed Andrea Barker is a 80 y.o.female  with a history of CKD, dementia, debilitted, HTN [facility] with being less responsive, hypoxic,  In the ED BP 115/66, TEMP 99.5, HR 100, RR 15 and sats 97% on 2 L WBC 21.7, Na 145, K 3.5, cl 109, Glucose 144, cr 1.46, albumin 2.6, lactate 2.9. CT abdomen showed  wall thickening and surrounding mesenteric inflammatory change extending from the cecum to the distal transverse colon. This appearance is consistent with colitis. 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  Pt had diarrhea and stool tested for cdiff  Pt was recently hospitalized between 07/31/20 08/03/20 with AMS, Was diagnosed as cystitis with proteus and given antibiotics and [facility]   Past Medical History:  Diagnosis Date  . Anxiety   . Arthritis   . GERD (gastroesophageal reflux disease)   . Hypertension   . Seasonal allergic rhinitis     Past Surgical History:  Procedure Laterality Date  . PARTIAL HYSTERECTOMY      Social History   Socioeconomic History  . Marital status: Divorced    Spouse name: Not on file  . Number of children: Not on file  . Years of education: Not on file  . Highest education level: Not on file  Occupational History  . Not on file  Tobacco Use  . Smoking status: Former Games developer  . Smokeless tobacco: Never Used  Substance and Sexual Activity  . Alcohol use: Yes    Comment: occaional  . Drug use: No  . Sexual activity: Not on file  Other Topics Concern  . Not on file  Social History Narrative  . Not on file   Social Determinants of Health   Financial Resource  Strain: Not on file  Food Insecurity: Not on file  Transportation Needs: Not on file  Physical Activity: Not on file  Stress: Not on file  Social Connections: Not on file  Intimate Partner Violence: Not on file    No family history on file. Allergies  Allergen Reactions  . Avelox [Moxifloxacin Hcl In Nacl] Swelling  . Calamine     RASH  . Clindamycin Diarrhea and Nausea Only  . Clindamycin/Lincomycin Nausea And Vomiting  . Doxepin     Other reaction(s): Diarrhea  . Fluorouracil     Other reaction(s): Rash  . Indomethacin Nausea And Vomiting  . Levaquin [Levofloxacin] Diarrhea  . Minocycline Diarrhea and Nausea Only  . Penicillins Swelling  . Sulfa Antibiotics Hives  . Vancomycin Swelling  . Latex Rash  . Neomycin-Bacitracin Zn-Polymyx Rash   I? Current Facility-Administered Medications  Medication Dose Route Frequency Provider Last Rate Last Admin  . 0.9 %  sodium chloride infusion  250 mL Intravenous Continuous Chesley Noon, MD   Stopped at 08/09/20 [phone]  . acetaminophen (TYLENOL) tablet 1,000 mg  1,000 mg Oral Q6H PRN Agbata, Tochukwu, MD      . Chlorhexidine Gluconate Cloth 2 % PADS 6 each  6 each Topical Daily Agbata, Tochukwu, MD   6 each at 08/09/20 0931  . enoxaparin (LOVENOX) injection 40 mg  40 mg Subcutaneous Q24H Lowella Bandy, [facility]      .  fidaxomicin (DIFICID) tablet 200 mg  200 mg Oral BID Enedina Finner, MD      . lactated ringers infusion   Intravenous Continuous Enedina Finner, MD 75 mL/hr at 08/09/20 1005 Rate Change at 08/09/20 1005  . metroNIDAZOLE (FLAGYL) IVPB 500 mg  500 mg Intravenous Hazle Quant, MD 100 mL/hr at 08/09/20 1212 500 mg at 08/09/20 1212  . mupirocin ointment (BACTROBAN) 2 % 1 application  1 application Nasal BID Agbata, Tochukwu, MD   1 application at 08/09/20 1214  . ondansetron (ZOFRAN) tablet 4 mg  4 mg Oral Q6H PRN Agbata, Tochukwu, MD       Or  . ondansetron (ZOFRAN) injection 4 mg  4 mg Intravenous Q6H PRN Agbata, Tochukwu, MD       . pantoprazole (PROTONIX) injection 40 mg  40 mg Intravenous Q24H Agbata, Tochukwu, MD   40 mg at 08/08/20 1620     Abtx:  Anti-infectives (From admission, onward)   Start     Dose/Rate Route Frequency Ordered Stop   08/09/20 1200  fidaxomicin (DIFICID) tablet 200 mg        200 mg Oral 2 times daily 08/09/20 1003 08/19/20 0959   08/09/20 1000  ceFEPIme (MAXIPIME) 2 g in sodium chloride 0.9 % 100 mL IVPB  Status:  Discontinued        2 g 200 mL/hr over 30 Minutes Intravenous Every 12 hours 08/09/20 0737 08/09/20 0916   08/09/20 1000  metroNIDAZOLE (FLAGYL) IVPB 500 mg        500 mg 100 mL/hr over 60 Minutes Intravenous Every 8 hours 08/09/20 0915     08/09/20 0800  ceFEPIme (MAXIPIME) 2 g in sodium chloride 0.9 % 100 mL IVPB  Status:  Discontinued        2 g 200 mL/hr over 30 Minutes Intravenous Every 24 hours 08/08/20 1300 08/09/20 0737   08/08/20 2000  ceFEPIme (MAXIPIME) 2 g in sodium chloride 0.9 % 100 mL IVPB  Status:  Discontinued        2 g 200 mL/hr over 30 Minutes Intravenous  Once 08/08/20 1228 08/08/20 1333   08/08/20 0800  ceFEPIme (MAXIPIME) 2 g in sodium chloride 0.9 % 100 mL IVPB        2 g 200 mL/hr over 30 Minutes Intravenous  Once 08/08/20 0753 08/08/20 0954   08/08/20 0800  metroNIDAZOLE (FLAGYL) IVPB 500 mg        500 mg 100 mL/hr over 60 Minutes Intravenous  Once 08/08/20 0753 08/08/20 0955      REVIEW OF SYSTEMS:  Very lethargic Hence not reliable Says I dont know Says she has diarrhea  Objective:  VITALS:  BP (!) 141/69   Pulse 91   Temp 98.7 F (37.1 C) (Oral)   Resp 15   Ht 5\' 5"  (1.651 m)   Wt 58 kg   SpO2 97%   BMI 21.28 kg/m  PHYSICAL EXAM:  General: lethargic, but responds to questions feebly  Head: Normocephalic, without obvious abnormality, atraumatic. Eyes: Conjunctivae clear, anicteric sclerae. Pupils are equal, mild ectropion lower lids ENT Nares normal. No drainage or sinus tenderness. Lips, mucosa, and tongue normal. No  Thrush Neck:  symmetrical, no adenopathy, thyroid: non tender no carotid bruit and no JVD.  Lungs: b/l air entry Heart: s1s2 Abdomen: Soft, non-tender,not distended. Bowel sounds normal. No masses Extremities: atraumatic, no cyanosis. No edema. No clubbing Skin: No rashes or lesions. Or bruising Lymph: Cervical, supraclavicular CNS-cannot assess  Pertinent Labs Lab Results  CBC    Component Value Date/Time   WBC 25.9 (H) 08/09/2020 0339   RBC 3.95 08/09/2020 0339   HGB 11.7 (L) 08/09/2020 0339   HCT 35.8 (L) 08/09/2020 0339   PLT 570 (H) 08/09/2020 0339   MCV 90.6 08/09/2020 0339   MCH 29.6 08/09/2020 0339   MCHC 32.7 08/09/2020 0339   RDW 13.8 08/09/2020 0339   LYMPHSABS 1.8 08/08/2020 0742   MONOABS 0.9 08/08/2020 0742   EOSABS 0.1 08/08/2020 0742   BASOSABS 0.1 08/08/2020 0742    CMP Latest Ref Rng & Units 08/09/2020 08/08/2020 08/02/2020  Glucose 70 - 99 mg/dL 161(W) 960(A) 540(J)  BUN 8 - 23 mg/dL 81(X) 91(Y) 78(G)  Creatinine 0.44 - 1.00 mg/dL 9.56(O) 1.30(Q) 6.57(Q)  Sodium 135 - 145 mmol/L 142 145 144  Potassium 3.5 - 5.1 mmol/L 3.1(L) 3.5 3.7  Chloride 98 - 111 mmol/L 110 109 110  CO2 22 - 32 mmol/L Calcium 8.9 - 10.3 mg/dL 7.7(L) 8.2(L) 8.8(L)  Total Protein 6.5 - 8.1 g/dL - 6.6 -  Total Bilirubin 0.3 - 1.2 mg/dL - 0.7 -  Alkaline Phos 38 - 126 U/L - 69 -  AST 15 - 41 U/L - 20 -  ALT 0 - 44 U/L - 16 -      Microbiology: Recent Results (from the past 240 hour(s))  Resp Panel by RT-PCR (Flu A&B, Covid) Nasopharyngeal Swab     Status: None   Collection Time: 07/31/20  4:00 PM   Specimen: Nasopharyngeal Swab; Nasopharyngeal(NP) swabs in vial transport medium  Result Value Ref Range Status   SARS Coronavirus 2 by RT PCR NEGATIVE NEGATIVE Final    Comment: (NOTE) SARS-CoV-2 target nucleic acids are NOT DETECTED.  The SARS-CoV-2 RNA is generally detectable in upper respiratory specimens during the acute phase of infection. The lowest concentration  of SARS-CoV-2 viral copies this assay can detect is 138 copies/mL. A negative result does not preclude SARS-Cov-2 infection and should not be used as the sole basis for treatment or other patient management decisions. A negative result may occur with  improper specimen collection/handling, submission of specimen other than nasopharyngeal swab, presence of viral mutation(s) within the areas targeted by this assay, and inadequate number of viral copies(<138 copies/mL). A negative result must be combined with clinical observations, patient history, and epidemiological information. The expected result is Negative.  Fact Sheet for Patients:  BloggerCourse.com  Fact Sheet for Healthcare Providers:  SeriousBroker.it  This test is no t yet approved or cleared by the [place] FDA and  has been authorized for detection and/or diagnosis of SARS-CoV-2 by FDA under an Emergency Use Authorization (EUA). This EUA will remain  in effect (meaning this test can be used) for the duration of the COVID-19 declaration under Section 564(b)(1) of the Act, 21 U.S.C.section 360bbb-3(b)(1), unless the authorization is terminated  or revoked sooner.       Influenza A by PCR NEGATIVE NEGATIVE Final   Influenza B by PCR NEGATIVE NEGATIVE Final    Comment: (NOTE) The Xpert Xpress SARS-CoV-2/FLU/RSV plus assay is intended as an aid in the diagnosis of influenza from Nasopharyngeal swab specimens and should not be used as a sole basis for treatment. Nasal washings and aspirates are unacceptable for Xpert Xpress SARS-CoV-2/FLU/RSV testing.  Fact Sheet for Patients: BloggerCourse.com  Fact Sheet for Healthcare Providers: SeriousBroker.it  This test is not yet approved or cleared by the [place] FDA and has been authorized for detection and/or diagnosis of  SARS-CoV-2 by FDA under an Emergency Use  Authorization (EUA). This EUA will remain in effect (meaning this test can be used) for the duration of the COVID-19 declaration under Section 564(b)(1) of the Act, 21 U.S.C. section 360bbb-3(b)(1), unless the authorization is terminated or revoked.  Performed at [facility], [address] [place], [place] [address]   Urine culture     Status: Abnormal   Collection Time: 07/31/20  4:00 PM   Specimen: In/Out Cath Urine  Result Value Ref Range Status   Specimen Description   Final    IN/OUT CATH URINE Performed at [facility], [address] [place], [place] [address]    Special Requests   Final    NONE Performed at [facility], [address] [place], [place] [address]    Culture >=100,000 COLONIES/mL PROTEUS MIRABILIS (A)  Final   Report Status 08/03/2020 FINAL  Final   Organism ID, Bacteria PROTEUS MIRABILIS (A)  Final      Susceptibility   Proteus mirabilis - MIC*    AMPICILLIN <=2 SENSITIVE Sensitive     CEFAZOLIN <=4 SENSITIVE Sensitive     CEFEPIME <=0.12 SENSITIVE Sensitive     CEFTRIAXONE <=0.25 SENSITIVE Sensitive     CIPROFLOXACIN >=4 RESISTANT Resistant     GENTAMICIN <=1 SENSITIVE Sensitive     IMIPENEM 2 SENSITIVE Sensitive     NITROFURANTOIN 128 RESISTANT Resistant     TRIMETH/SULFA 40 SENSITIVE Sensitive     AMPICILLIN/SULBACTAM <=2 SENSITIVE Sensitive     PIP/TAZO <=4 SENSITIVE Sensitive     * >=100,000 COLONIES/mL PROTEUS MIRABILIS  SARS Coronavirus 2 by RT PCR (hospital order, performed in [facility] hospital lab) Nasopharyngeal Nasopharyngeal Swab     Status: None   Collection Time: 08/03/20 11:26 AM   Specimen: Nasopharyngeal Swab  Result Value Ref Range Status   SARS Coronavirus 2 NEGATIVE NEGATIVE Final    Comment: (NOTE) SARS-CoV-2 target nucleic acids are NOT DETECTED.  The SARS-CoV-2 RNA is generally detectable in upper and lower respiratory specimens during the acute phase of infection. The  lowest concentration of SARS-CoV-2 viral copies this assay can detect is 250 copies / mL. A negative result does not preclude SARS-CoV-2 infection and should not be used as the sole basis for treatment or other patient management decisions.  A negative result may occur with improper specimen collection / handling, submission of specimen other than nasopharyngeal swab, presence of viral mutation(s) within the areas targeted by this assay, and inadequate number of viral copies (<250 copies / mL). A negative result must be combined with clinical observations, patient history, and epidemiological information.  Fact Sheet for Patients:   BoilerBrush.com.cy  Fact Sheet for Healthcare Providers: https://pope.com/  This test is not yet approved or  cleared by the [place] FDA and has been authorized for detection and/or diagnosis of SARS-CoV-2 by FDA under an Emergency Use Authorization (EUA).  This EUA will remain in effect (meaning this test can be used) for the duration of the COVID-19 declaration under Section 564(b)(1) of the Act, 21 U.S.C. section 360bbb-3(b)(1), unless the authorization is terminated or revoked sooner.  Performed at [facility], [address] [place], [place] [address]   Culture, blood (routine x 2)     Status: None (Preliminary result)   Collection Time: 08/08/20  7:42 AM   Specimen: BLOOD  Result Value Ref Range Status   Specimen Description BLOOD RIGHT WRIST  Final   Special Requests   Final  BOTTLES DRAWN AEROBIC AND ANAEROBIC Blood Culture results may not be optimal due to an inadequate volume of blood received in culture bottles   Culture   Final    NO GROWTH < 24 HOURS Performed at [facility], [address] [place], [place] [address]    Report Status PENDING  Incomplete  Culture, blood (routine x 2)     Status: None (Preliminary result)   Collection Time: 08/08/20  7:42 AM    Specimen: BLOOD  Result Value Ref Range Status   Specimen Description BLOOD RIGHT ANTECUBITAL  Final   Special Requests   Final    BOTTLES DRAWN AEROBIC AND ANAEROBIC Blood Culture results may not be optimal due to an inadequate volume of blood received in culture bottles   Culture   Final    NO GROWTH < 24 HOURS Performed at [facility], [address] [place], [place] [address]    Report Status PENDING  Incomplete  Resp Panel by RT-PCR (Flu A&B, Covid) Nasopharyngeal Swab     Status: None   Collection Time: 08/08/20  7:42 AM   Specimen: Nasopharyngeal Swab; Nasopharyngeal(NP) swabs in vial transport medium  Result Value Ref Range Status   SARS Coronavirus 2 by RT PCR NEGATIVE NEGATIVE Final    Comment: (NOTE) SARS-CoV-2 target nucleic acids are NOT DETECTED.  The SARS-CoV-2 RNA is generally detectable in upper respiratory specimens during the acute phase of infection. The lowest concentration of SARS-CoV-2 viral copies this assay can detect is 138 copies/mL. A negative result does not preclude SARS-Cov-2 infection and should not be used as the sole basis for treatment or other patient management decisions. A negative result may occur with  improper specimen collection/handling, submission of specimen other than nasopharyngeal swab, presence of viral mutation(s) within the areas targeted by this assay, and inadequate number of viral copies(<138 copies/mL). A negative result must be combined with clinical observations, patient history, and epidemiological information. The expected result is Negative.  Fact Sheet for Patients:  BloggerCourse.com  Fact Sheet for Healthcare Providers:  SeriousBroker.it  This test is no t yet approved or cleared by the [place] FDA and  has been authorized for detection and/or diagnosis of SARS-CoV-2 by FDA under an Emergency Use Authorization (EUA). This EUA will remain  in  effect (meaning this test can be used) for the duration of the COVID-19 declaration under Section 564(b)(1) of the Act, 21 U.S.C.section 360bbb-3(b)(1), unless the authorization is terminated  or revoked sooner.       Influenza A by PCR NEGATIVE NEGATIVE Final   Influenza B by PCR NEGATIVE NEGATIVE Final    Comment: (NOTE) The Xpert Xpress SARS-CoV-2/FLU/RSV plus assay is intended as an aid in the diagnosis of influenza from Nasopharyngeal swab specimens and should not be used as a sole basis for treatment. Nasal washings and aspirates are unacceptable for Xpert Xpress SARS-CoV-2/FLU/RSV testing.  Fact Sheet for Patients: BloggerCourse.com  Fact Sheet for Healthcare Providers: SeriousBroker.it  This test is not yet approved or cleared by the [place] FDA and has been authorized for detection and/or diagnosis of SARS-CoV-2 by FDA under an Emergency Use Authorization (EUA). This EUA will remain in effect (meaning this test can be used) for the duration of the COVID-19 declaration under Section 564(b)(1) of the Act, 21 U.S.C. section 360bbb-3(b)(1), unless the authorization is terminated or revoked.  Performed at [facility], [address] [place], [place] [address]   Urine culture     Status: None  Collection Time: 08/08/20  8:52 AM   Specimen: Urine, Catheterized  Result Value Ref Range Status   Specimen Description   Final    URINE, CATHETERIZED Performed at [facility], [address] [place], [place] [address]    Special Requests   Final    NONE Performed at [facility], [address] [place], [place] [address]    Culture   Final    NO GROWTH Performed at [facility], [address] [place], [place] [address]    Report Status 08/09/2020 FINAL  Final  C Difficile Quick Screen w PCR reflex     Status: Abnormal   Collection Time: 08/09/20  4:40 AM   Specimen: STOOL   Result Value Ref Range Status   C Diff antigen POSITIVE (A) NEGATIVE Final   C Diff toxin POSITIVE (A) NEGATIVE Final   C Diff interpretation Toxin producing C. difficile detected.  Final    Comment: CRITICAL RESULT CALLED TO, READ BACK BY AND VERIFIED WITH: MARIA JODLOMAN@0618  08/09/20 RH Performed at [facility], [address] [place], [place] [address]   MRSA PCR Screening     Status: Abnormal   Collection Time: 08/09/20  4:40 AM   Specimen: Nasal Mucosa; Nasopharyngeal  Result Value Ref Range Status   MRSA by PCR POSITIVE (A) NEGATIVE Final    Comment:        The GeneXpert MRSA Assay (FDA approved for NASAL specimens only), is one component of a comprehensive MRSA colonization surveillance program. It is not intended to diagnose MRSA infection nor to guide or monitor treatment for MRSA infections. RESULT CALLED TO, READ BACK BY AND VERIFIED WITH: Gregary Cromer @0610  3/922 RH Performed at [facility], [address] [place], [place] [address]     IMAGING RESULTS:  I have personally reviewed the films ? Impression/Recommendation ? ? Encephalopathy- combination of poor intake, dehydration and cdiff and meds  Cdiff colitis- there is a h/o of vanco allergy- hence fidaxomicin IV flagyl to continue until we know for sure she can reliably take oral meds  Mesenteric ischemia? Atherosclerotic disease  AKI-due to dehydration - improved with fluids  ___________________________________________________ Discussed with care team Note:  This document was prepared using Dragon voice recognition software and may include unintentional dictation errors.

## 2020-08-10 DIAGNOSIS — Z7189 Other specified counseling: Secondary | ICD-10-CM | POA: Diagnosis not present

## 2020-08-10 DIAGNOSIS — N1831 Chronic kidney disease, stage 3a: Secondary | ICD-10-CM | POA: Diagnosis not present

## 2020-08-10 DIAGNOSIS — F0390 Unspecified dementia without behavioral disturbance: Secondary | ICD-10-CM

## 2020-08-10 DIAGNOSIS — Z515 Encounter for palliative care: Secondary | ICD-10-CM

## 2020-08-10 DIAGNOSIS — K559 Vascular disorder of intestine, unspecified: Secondary | ICD-10-CM | POA: Diagnosis not present

## 2020-08-10 DIAGNOSIS — A0472 Enterocolitis due to Clostridium difficile, not specified as recurrent: Secondary | ICD-10-CM | POA: Diagnosis not present

## 2020-08-10 DIAGNOSIS — A419 Sepsis, unspecified organism: Secondary | ICD-10-CM | POA: Diagnosis not present

## 2020-08-10 DIAGNOSIS — N170 Acute kidney failure with tubular necrosis: Secondary | ICD-10-CM

## 2020-08-10 DIAGNOSIS — R5381 Other malaise: Secondary | ICD-10-CM | POA: Diagnosis not present

## 2020-08-10 DIAGNOSIS — R627 Adult failure to thrive: Secondary | ICD-10-CM | POA: Diagnosis not present

## 2020-08-10 LAB — BASIC METABOLIC PANEL
Anion gap: 6 (ref 5–15)
BUN: 19 mg/dL (ref 8–23)
CO2: 24 mmol/L (ref 22–32)
Calcium: 7.6 mg/dL — ABNORMAL LOW (ref 8.9–10.3)
Chloride: 109 mmol/L (ref 98–111)
Creatinine, Ser: 0.9 mg/dL (ref 0.44–1.00)
GFR, Estimated: >60 mL/min (ref >60)
Glucose, Bld: 101 mg/dL — ABNORMAL HIGH (ref 70–99)
Potassium: 3 mmol/L — ABNORMAL LOW (ref 3.5–5.1)
Sodium: 139 mmol/L (ref 135–145)

## 2020-08-10 LAB — PROCALCITONIN: Procalcitonin: 0.15 ng/mL

## 2020-08-10 LAB — MAGNESIUM: Magnesium: 2.1 mg/dL (ref 1.7–2.4)

## 2020-08-10 MED ORDER — GERHARDT'S BUTT CREAM
TOPICAL_CREAM | CUTANEOUS | Status: DC | PRN
  Filled 2020-08-10 (×2): qty 1

## 2020-08-10 MED ORDER — POTASSIUM CHLORIDE 10 MEQ/100ML IV SOLN
10.0000 meq | INTRAVENOUS | Status: AC
Start: 2020-08-10 — End: 2020-08-10
  Administered 2020-08-10 (×4): 10 meq via INTRAVENOUS
  Filled 2020-08-10 (×4): qty 100

## 2020-08-10 MED ORDER — SERTRALINE HCL 50 MG PO TABS
50.0000 mg | ORAL_TABLET | Freq: Every day | ORAL | Status: DC
  Administered 2020-08-10 – 2020-08-13 (×4): 50 mg via ORAL
  Filled 2020-08-10 (×4): qty 1

## 2020-08-10 MED ORDER — AMLODIPINE BESYLATE 10 MG PO TABS
10.0000 mg | ORAL_TABLET | Freq: Every day | ORAL | Status: DC
  Administered 2020-08-10 – 2020-08-14 (×5): 10 mg via ORAL
  Filled 2020-08-10 (×5): qty 1

## 2020-08-10 MED ORDER — PANTOPRAZOLE SODIUM 40 MG PO TBEC
40.0000 mg | DELAYED_RELEASE_TABLET | Freq: Every day | ORAL | Status: DC
  Administered 2020-08-10 – 2020-08-14 (×5): 40 mg via ORAL
  Filled 2020-08-10 (×5): qty 1

## 2020-08-10 NOTE — Progress Notes (Signed)
PHARMACY CONSULT NOTE  Pharmacy Consult for Electrolyte Monitoring and Replacement   Recent Labs: Potassium (mmol/L)  Date Value  08/10/2020 3.0 (L)   Magnesium (mg/dL)  Date Value  83/02/4075 2.1   Calcium (mg/dL)  Date Value  80/88/1103 7.6 (L)   Albumin (g/dL)  Date Value  15/94/5859 2.6 (L)   Phosphorus (mg/dL)  Date Value  29/24/4628 3.6   Sodium (mmol/L)  Date Value  08/10/2020 139   Corrected Ca: 8.8 mg/dL  Assessment: 80 y.o. female admitted on 08/08/2020 with sepsis. Pt presented with AMS. Pt recently discharged from admission for UTI 2/28-3/3. PMH includes dementia, HTN, CKD, venous stasis dermatitis, chronic back pain, and chronic respiratory failure on 2L O2.  MIVF: lactated ringers at 75 mL/hr  Goal of Therapy:  Electrolytes WNL  Plan:   10 mEq IV KCl x 4  Re-check electrolytes in am  Lowella Bandy ,PharmD Clinical Pharmacist 08/10/2020 7:04 AM

## 2020-08-10 NOTE — Progress Notes (Addendum)
[facility]  - [place] at [facility]   PATIENT NAME: Andrea Barker    MR#:  [id number]  DATE OF BIRTH:  1941/03/18  SUBJECTIVE:   Patient more awake and alert. Did answer a few questions appropriately.  No family in the room.  Tolerating PO meds. Picky eater. Will change to regular diet today.Diarrhea slowed down Per RN. Blood pressure much better. REVIEW OF SYSTEMS:   Review of Systems  Constitutional: Negative for chills, fever and weight loss.  HENT: Negative for ear discharge, ear pain and nosebleeds.   Eyes: Negative for blurred vision, pain and discharge.  Respiratory: Negative for sputum production, shortness of breath, wheezing and stridor.   Cardiovascular: Negative for chest pain, palpitations, orthopnea and PND.  Gastrointestinal: Positive for diarrhea. Negative for abdominal pain, nausea and vomiting.  Genitourinary: Negative for frequency and urgency.  Musculoskeletal: Negative for back pain and joint pain.  Neurological: Positive for weakness. Negative for sensory change, speech change and focal weakness.  Psychiatric/Behavioral: Negative for depression and hallucinations. The patient is not nervous/anxious.    Tolerating Diet:some Tolerating PT:   DRUG ALLERGIES:   Allergies  Allergen Reactions  . Avelox [Moxifloxacin Hcl In Nacl] Swelling  . Calamine     RASH  . Clindamycin Diarrhea and Nausea Only  . Clindamycin/Lincomycin Nausea And Vomiting  . Doxepin     Other reaction(s): Diarrhea  . Fluorouracil     Other reaction(s): Rash  . Indomethacin Nausea And Vomiting  . Levaquin [Levofloxacin] Diarrhea  . Minocycline Diarrhea and Nausea Only  . Penicillins Swelling  . Sulfa Antibiotics Hives  . Vancomycin Swelling  . Latex Rash  . Neomycin-Bacitracin Zn-Polymyx Rash    VITALS:  Blood pressure 136/69, pulse 78, temperature 98.4 F (36.9 C), temperature source Oral, resp. rate 16, height 5\' 5"  (1.651 m), weight 58 kg, SpO2 93  %.  PHYSICAL EXAMINATION:   Physical Exam appears weak and deconditioned GENERAL:  80 y.o.-year-old patient lying in the bed with no acute distress.  LUNGS: Normal breath sounds bilaterally, no wheezing, rales, rhonchi. No use of accessory muscles of respiration.  CARDIOVASCULAR: S1, S2 normal. No murmurs, rubs, or gallops.  ABDOMEN: Soft, nontender, mildly distended. Bowel sounds present. EXTREMITIES: No cyanosis, clubbing or edema b/l.    NEUROLOGIC: nonfocal. Moves all extremities well. PSYCHIATRIC:  patient is alert and awake SKIN: No obvious rash, lesion, or ulcer.   LABORATORY PANEL:  CBC Recent Labs  Lab 08/09/20 0339  WBC 25.9*  HGB 11.7*  HCT 35.8*  PLT 570*    Chemistries  Recent Labs  Lab 08/08/20 0742 08/09/20 0339 08/10/20 0415  NA 145   < > 139  K 3.5   < > 3.0*  CL 109   < > 109  CO2 23   < > 24  GLUCOSE 144*   < > 101*  BUN 35*   < > 19  CREATININE 1.46*   < > 0.90  CALCIUM 8.2*   < > 7.6*  MG  --    < > 2.1  AST 20  --   --   ALT 16  --   --   ALKPHOS 69  --   --   BILITOT 0.7  --   --    < > = values in this interval not displayed.   Cardiac Enzymes No results for input(s): TROPONINI in the last 168 hours. RADIOLOGY:  No results found. ASSESSMENT AND PLAN:   Andrea Barker is a  80 y.o. female with medical history significant for recent hospitalization for UTI, hypertension, chronic venous stasis GERD, dementia, chronic respiratory failure on 2 L of oxygen bedbound status who was sent to the patient for evaluation for possible sepsis.  Per EMS patient was hypoxic and her oxygen had to be increased to 4 L to maintain pulse oximetry greater than 92%.   Severe Sepsis (POA)/acute renal failure due to Clostridium difficile colitis Sepsis improved --As evidenced by hypotension requiring IV fluid resuscitation, marked leukocytosis as well as elevated lactic acid level --Source of sepsis appears to be possible Clostridium difficile colitis  (ischemic to rule out C. difficile colitis --patient was recently hospitalized and discharged on March 3 for UTI and urine cultures yielded Proteus mirabilis.  She was discharged back to the skilled nursing facility on Keflex to complete a 1 week course of therapy --recieved IV fluid resuscitation --3/9-- infectious disease consultation-- IV Flagyl and Dificid (some allergy with vancomycin) -- clear liquid diet -- 3/10--advance as tolerated to soft today. D/c flagyl, cont Dificid, diarrhea decreased. No fever, BP stable  Ischemic colitis Noted on imaging studies -On physical exam patient is non tender -Findings may be secondary to low flow state from hypotension -- patient was seen by vascular surgery Dr. Wyn Quaker. She has celiac vessel stenosis which likely not contributing to any of the current symptoms. No vascular procedure recommended.  Functional quadriplegia/Bed bound status Secondary to advanced dementia and adult failure to thrive Patient requires assistance with all activities of daily living patient is [facility] -seen by Palliative care--will have hospice follow at the facility per discussion with fmally  Hypertension -- home meds on hold losartan, HCTZ and amlodipine at this time due to hypotension -- 3/10--blood pressure stable today --start amlodipine for now  acute renal failure superimposed on CKD stage IIIA mild dehydration -Secondary to poor oral intake, aiarrhea and  and concomitant diuretic therapy Baseline serum creatinine is about 1.02 on admission it is 1.46 --creat down to 1.02--0.9 --d/c IVF   DVT prophylaxis: Lovenox Code Status: DO NOT RESUSCITATE prior to admission Family Communication:  left message for son Burt Knack  disposition Plan: Back [facility] environment[facility] Consults called: None Status: inpatient PLevel of care: Med-Surg Status is: Inpatient  Remains inpatient appropriate because Being treated for C.  difficile colitis, acute renal failure, failure to thrive with poor PO  Dispo: The patient is from: SNF              Anticipated d/c is to: SNF              Patient currently is not medically stable to d/c.   Difficult to place patient No  Patient continues to show improvement clinically. Still has poor PO intake. Continue to monitor labs with CBC tomorrow and replace electrolytes. If continues to show improvement likely discharge tomorrow else on Saturday with hospice back to her long-term facility.     TOTAL TIME TAKING CARE OF THIS PATIENT: 25 minutes.  >50% time spent on counselling and coordination of care  Note: This dictation was prepared with Dragon dictation along with smaller phrase technology. Any transcriptional errors that result from this process are unintentional.  Enedina Finner M.D    [facility]   CC: Primary care physician; [facility]call[facility], Doctors MakingPatient ID: Andrea Barker, female   DOB: Nov 21, 1940, 80 y.o.   MRN: [id number]

## 2020-08-10 NOTE — Progress Notes (Addendum)
[facility] Room IC20 [employer], contracting [facility]) Hospital Liaison RN note:  Received request from Harvest Dark, NP for hospice services at [facility] after discharge. Chart and patient information under review by [facility] Physician. Hospice eligibility was approved.  Spoke with daughter in law, Jasmine December to initiate education related to hospice philosophy, services and to answer any questions. She verbalized understanding and all questions were answered. Plan is to discharge back to [facility] possibly Saturday. Jasmine December is the contact person.  Please send signed and completed DNR home with patient. Please provide prescriptions at discharge as needed to ensure ongoing symptom management.  Please call with any hospice related questions or concerns.  Thank you for the opportunity to participate in this patient's care.  Cyndra Numbers, RN [facility] Liaison  [phone]

## 2020-08-10 NOTE — Progress Notes (Signed)
ID  Very weak, pale, feeble voice Patient Vitals for the past 24 hrs:  BP Temp Temp src Pulse Resp SpO2  08/10/20 1800 (!) 144/77 - - 95 (!) 22 91 %  08/10/20 1700 (!) 142/73 - - 88 16 (!) 89 %  08/10/20 1600 139/74 - - 92 (!) 25 (!) 89 %  08/10/20 1500 128/69 - - 79 16 91 %  08/10/20 1400 (!) 143/74 - - 81 19 94 %  08/10/20 1300 136/69 - - 78 16 93 %  08/10/20 1200 129/71 (!) 97.2 F (36.2 C) Axillary 86 15 92 %  08/10/20 1100 (!) 144/77 - - 80 15 94 %  08/10/20 1000 134/79 - - 70 12 99 %  08/10/20 0900 (!) 141/72 - - 68 12 99 %  08/10/20 0800 121/61 98.4 F (36.9 C) Oral (!) 48 13 98 %  08/10/20 0700 114/69 - - 70 13 98 %  08/10/20 0500 118/67 - - 69 12 98 %  08/10/20 0300 (!) 101/52 98.4 F (36.9 C) Oral 82 13 97 %  08/10/20 0100 111/61 - - 87 14 96 %  08/09/20 2300 126/69 (!) 100.7 F (38.2 C) Oral 88 17 97 %  08/09/20 2100 125/67 - - 88 16 96 %    Lethargic Responds to questions but takes some time Pale Skin: In the legs multiple scabs Arms sun damaged Chest bilateral air entry Heart sounds S1-S2 Abdomen soft CNS cannot be assessed  Labs CBC Latest Ref Rng & Units 08/09/2020 08/08/2020 07/31/2020  WBC 4.0 - 10.5 K/uL 25.9(H) 21.7(H) 17.6(H)  Hemoglobin 12.0 - 15.0 g/dL 11.7(L) 13.8 13.5  Hematocrit 36.0 - 46.0 % 35.8(L) 42.2 41.3  Platelets 150 - 400 K/uL 570(H) 594(H) 508(H)   CMP Latest Ref Rng & Units 08/10/2020 08/09/2020 08/08/2020  Glucose 70 - 99 mg/dL 539(J) 673(A) 193(X)  BUN 8 - 23 mg/dL 19 90(W) 40(X)  Creatinine 0.44 - 1.00 mg/dL 7.35 3.29(J) 2.42(A)  Sodium 135 - 145 mmol/L 139 142 145  Potassium 3.5 - 5.1 mmol/L 3.0(L) 3.1(L) 3.5  Chloride 98 - 111 mmol/L 109 110 109  CO2 22 - 32 mmol/L 24 26 23   Calcium 8.9 - 10.3 mg/dL 7.6(L) 7.7(L) 8.2(L)  Total Protein 6.5 - 8.1 g/dL - - 6.6  Total Bilirubin 0.3 - 1.2 mg/dL - - 0.7  Alkaline Phos 38 - 126 U/L - - 69  AST 15 - 41 U/L - - 20  ALT 0 - 44 U/L - - 16   08/08/2020 blood culture negative so far 08/08/20  urine culture negative 08/08/20 stool C. difficile positive   Celiac: Severe stenosis at the origin with poststenotic dilatation measuring up to 10 mm.  SMA: Patent without evidence of aneurysm, dissection, vasculitis or significant stenosis. IMA: Severe stenosis at the origin but otherwise patent.  Impression/recommendation Encephalopathy: Multifactorial  C. difficile colitis currently on daptomycin..  But WBC is still increased.  Concern for mesenteric ischemia. IMA stenotic at the origin but patent otherwise SMA is patent Celiac is stenotic.  AKI has resolved  Bedbound status  Hypertension: Started amlodipine today.  Other meds which are HCTZ and losartan is on hold. Discussed the management with the care team.

## 2020-08-11 DIAGNOSIS — R5381 Other malaise: Secondary | ICD-10-CM | POA: Diagnosis not present

## 2020-08-11 DIAGNOSIS — A419 Sepsis, unspecified organism: Secondary | ICD-10-CM | POA: Diagnosis not present

## 2020-08-11 DIAGNOSIS — N1831 Chronic kidney disease, stage 3a: Secondary | ICD-10-CM | POA: Diagnosis not present

## 2020-08-11 DIAGNOSIS — A0472 Enterocolitis due to Clostridium difficile, not specified as recurrent: Secondary | ICD-10-CM | POA: Diagnosis not present

## 2020-08-11 LAB — RENAL FUNCTION PANEL
Albumin: 2.1 g/dL — ABNORMAL LOW (ref 3.5–5.0)
Anion gap: 10 (ref 5–15)
BUN: 14 mg/dL (ref 8–23)
CO2: 21 mmol/L — ABNORMAL LOW (ref 22–32)
Calcium: 7.9 mg/dL — ABNORMAL LOW (ref 8.9–10.3)
Chloride: 107 mmol/L (ref 98–111)
Creatinine, Ser: 0.86 mg/dL (ref 0.44–1.00)
GFR, Estimated: >60 mL/min (ref >60)
Glucose, Bld: 89 mg/dL (ref 70–99)
Phosphorus: 1.6 mg/dL — ABNORMAL LOW (ref 2.5–4.6)
Potassium: 3.1 mmol/L — ABNORMAL LOW (ref 3.5–5.1)
Sodium: 138 mmol/L (ref 135–145)

## 2020-08-11 LAB — CBC
HCT: 35.4 % — ABNORMAL LOW (ref 36.0–46.0)
Hemoglobin: 11.6 g/dL — ABNORMAL LOW (ref 12.0–15.0)
MCH: 28.9 pg (ref 26.0–34.0)
MCHC: 32.8 g/dL (ref 30.0–36.0)
MCV: 88.3 fL (ref 80.0–100.0)
Platelets: 560 10*3/uL — ABNORMAL HIGH (ref 150–400)
RBC: 4.01 MIL/uL (ref 3.87–5.11)
RDW: 13.8 % (ref 11.5–15.5)
WBC: 24.7 10*3/uL — ABNORMAL HIGH (ref 4.0–10.5)
nRBC: 0 % (ref 0.0–0.2)

## 2020-08-11 LAB — MAGNESIUM: Magnesium: 1.9 mg/dL (ref 1.7–2.4)

## 2020-08-11 MED ORDER — POTASSIUM CHLORIDE IN NACL 20-0.9 MEQ/L-% IV SOLN
INTRAVENOUS | Status: DC
  Filled 2020-08-11 (×8): qty 1000

## 2020-08-11 MED ORDER — MAGNESIUM SULFATE IN D5W 1-5 GM/100ML-% IV SOLN
1.0000 g | Freq: Once | INTRAVENOUS | Status: AC
  Administered 2020-08-11: 1 g via INTRAVENOUS
  Filled 2020-08-11: qty 100

## 2020-08-11 MED ORDER — POTASSIUM CHLORIDE 10 MEQ/100ML IV SOLN
10.0000 meq | INTRAVENOUS | Status: AC
Start: 2020-08-11 — End: 2020-08-11
  Administered 2020-08-11 (×4): 10 meq via INTRAVENOUS
  Filled 2020-08-11 (×5): qty 100

## 2020-08-11 MED ORDER — K PHOS MONO-SOD PHOS DI & MONO 155-852-130 MG PO TABS
500.0000 mg | ORAL_TABLET | Freq: Four times a day (QID) | ORAL | Status: AC
  Administered 2020-08-11 (×4): 500 mg via ORAL
  Filled 2020-08-11 (×4): qty 2

## 2020-08-11 NOTE — Progress Notes (Signed)
ID Pt is out of icu More alert Denies any abdominal pain Greenish diarrhea  Patient Vitals for the past 24 hrs:  BP Temp Temp src Pulse Resp SpO2  08/11/20 1111 (!) 141/72 98.9 F (37.2 C) -- 85 16 90 %  08/11/20 0811 (!) 143/79 99.3 F (37.4 C) -- 86 17 91 %  08/11/20 0349 (!) 142/82 98 F (36.7 C) Oral 89 18 91 %  08/10/20 2349 140/80 97.9 F (36.6 C) Oral 91 18 90 %  08/10/20 2120 (!) 141/64 98.8 F (37.1 C) Oral 100 18 91 %  08/10/20 1800 (!) 144/77 -- -- 95 (!) 22 91 %  08/10/20 1700 (!) 142/73 -- -- 88 16 (!) 89 %  08/10/20 1600 139/74 -- -- 92 (!) 25 (!) 89 %  08/10/20 1500 128/69 -- -- 79 16 91 %  08/10/20 1400 (!) 143/74 -- -- 81 19 94 %   O/E more alert More responsive Chest b/l air entry [id number] abd soft Extremities- scabs and chronic venous stasis  labs  CBC Latest Ref Rng & Units 08/11/2020 08/09/2020 08/08/2020  WBC 4.0 - 10.5 K/uL 24.7(H) 25.9(H) 21.7(H)  Hemoglobin 12.0 - 15.0 g/dL 11.6(L) 11.7(L) 13.8  Hematocrit 36.0 - 46.0 % 35.4(L) 35.8(L) 42.2  Platelets 150 - 400 K/uL 560(H) 570(H) 594(H)    CMP Latest Ref Rng & Units 08/11/2020 08/10/2020 08/09/2020  Glucose 70 - 99 mg/dL 89 423(N) 361(W)  BUN 8 - 23 mg/dL 14 19 43(X)  Creatinine 0.44 - 1.00 mg/dL 5.40 0.86 7.61(P)  Sodium 135 - 145 mmol/L 138 139 142  Potassium 3.5 - 5.1 mmol/L 3.1(L) 3.0(L) 3.1(L)  Chloride 98 - 111 mmol/L 107 109 110  CO2 22 - 32 mmol/L 21(L) 24 26  Calcium 8.9 - 10.3 mg/dL 7.9(L) 7.6(L) 7.7(L)  Total Protein 6.5 - 8.1 g/dL - - -  Total Bilirubin 0.3 - 1.2 mg/dL - - -  Alkaline Phos 38 - 126 U/L - - -  AST 15 - 41 U/L - - -  ALT 0 - 44 U/L - - -   Diffuse thickening of the wall of the of the colon with adjacent fat stranding consistent with acute colitis   Impression/recommendation Encephalopathy- improving  C. difficile colitis.  Currently on fidaxomicin.  WBC still high. If no improvement tomorrow woud recommend getting XRAY abdomen to look for dilated loops  Concern  for mesenteric ischemia.  IMA stenotic at the origin but patent otherwise.  SMA is patent.  Celiac axis stenotic  AKI - resolved  Hypomagnesemia and hypokalemia- being corrected  Discussed the management with care team

## 2020-08-11 NOTE — Care Management Important Message (Signed)
Important Message  Patient Details  Name: Andrea Barker MRN: [id number] Date of Birth: [birth year]   Medicare Important Message Given:  Yes  Patient is in an isolation room and CM has been discussing care with D-I-L Andrea Barker.  I called the number on file it was for son, Andrea Barker [phone].  Both of them were on the call and I reviewed the Important Message from Medicare by phone and asked if they were in agreement with the discharge plan to return to SNF when medically ready and they said yes.  I asked if they would like Andrea copy of the form and they replied yes and could it be emailed and I said yes.  I have sent the  form securely to [email or web address] as asked. I thanked them for their time.  Andrea Barker Andrea Barker 08/11/2020, 2:48 PM

## 2020-08-11 NOTE — Progress Notes (Signed)
[facility]  - [place] at [facility]   PATIENT NAME: Andrea Barker    MR#:  [id number]  DATE OF BIRTH:  1940-09-07  SUBJECTIVE:  'I am tired" Patient more awake and alert. Did answer a few questions appropriately.  No family in the room.  Tolerating PO meds.  Blood pressure much better. Per RN ate about 25% of BF REVIEW OF SYSTEMS:   Review of Systems  Constitutional: Negative for chills, fever and weight loss.  HENT: Negative for ear discharge, ear pain and nosebleeds.   Eyes: Negative for blurred vision, pain and discharge.  Respiratory: Negative for sputum production, shortness of breath, wheezing and stridor.   Cardiovascular: Negative for chest pain, palpitations, orthopnea and PND.  Gastrointestinal: Positive for diarrhea. Negative for abdominal pain, nausea and vomiting.  Genitourinary: Negative for frequency and urgency.  Musculoskeletal: Negative for back pain and joint pain.  Neurological: Positive for weakness. Negative for sensory change, speech change and focal weakness.  Psychiatric/Behavioral: Negative for depression and hallucinations. The patient is not nervous/anxious.    Tolerating Diet:some Tolerating PT: bed bound  DRUG ALLERGIES:   Allergies  Allergen Reactions  . Avelox [Moxifloxacin Hcl In Nacl] Swelling  . Calamine     RASH  . Clindamycin Diarrhea and Nausea Only  . Clindamycin/Lincomycin Nausea And Vomiting  . Doxepin     Other reaction(s): Diarrhea  . Fluorouracil     Other reaction(s): Rash  . Indomethacin Nausea And Vomiting  . Levaquin [Levofloxacin] Diarrhea  . Minocycline Diarrhea and Nausea Only  . Penicillins Swelling  . Sulfa Antibiotics Hives  . Vancomycin Swelling  . Latex Rash  . Neomycin-Bacitracin Zn-Polymyx Rash    VITALS:  Blood pressure (!) 141/72, pulse 85, temperature 98.9 F (37.2 C), resp. rate 16, height 5\' 5"  (1.651 m), weight 58 kg, SpO2 90 %.  PHYSICAL EXAMINATION:   Physical Exam appears  weak and deconditioned GENERAL:  80 y.o.-year-old patient lying in the bed with no acute distress.  LUNGS: Normal breath sounds bilaterally, no wheezing, rales, rhonchi. No use of accessory muscles of respiration.  CARDIOVASCULAR: S1, S2 normal. No murmurs, rubs, or gallops.  ABDOMEN: Soft, nontender, mildly distended. Bowel sounds present. EXTREMITIES: No cyanosis, clubbing or edema b/l.    NEUROLOGIC: nonfocal. Moves all extremities well. PSYCHIATRIC:  patient is alert and awake SKIN: No obvious rash, lesion, or ulcer.   LABORATORY PANEL:  CBC Recent Labs  Lab 08/11/20 0548  WBC 24.7*  HGB 11.6*  HCT 35.4*  PLT 560*    Chemistries  Recent Labs  Lab 08/08/20 0742 08/09/20 0339 08/11/20 0548  NA 145   < > 138  K 3.5   < > 3.1*  CL 109   < > 107  CO2 23   < > 21*  GLUCOSE 144*   < > 89  BUN 35*   < > 14  CREATININE 1.46*   < > 0.86  CALCIUM 8.2*   < > 7.9*  MG  --    < > 1.9  AST 20  --   --   ALT 16  --   --   ALKPHOS 69  --   --   BILITOT 0.7  --   --    < > = values in this interval not displayed.   Cardiac Enzymes No results for input(s): TROPONINI in the last 168 hours. RADIOLOGY:  No results found. ASSESSMENT AND PLAN:   Andrea Barker is a 80 y.o. female  with medical history significant for recent hospitalization for UTI, hypertension, chronic venous stasis GERD, dementia, chronic respiratory failure on 2 L of oxygen bedbound status who was sent to the patient for evaluation for possible sepsis.  Per EMS patient was hypoxic and her oxygen had to be increased to 4 L to maintain pulse oximetry greater than 92%.   Severe Sepsis (POA)/acute renal failure due to Clostridium difficile colitis Sepsis improved/resolved --As evidenced by hypotension requiring IV fluid resuscitation, marked leukocytosis as well as elevated lactic acid level --Source of sepsis appears to be possible Clostridium difficile colitis (ischemic to rule out C. difficile colitis --patient  was recently hospitalized and discharged on March 3 for UTI and urine cultures yielded Proteus mirabilis.  She was discharged back to the skilled nursing facility on Keflex to complete a 1 week course of therapy --recieved IV fluid resuscitation --3/9-- infectious disease consultation-- IV Flagyl and Dificid (some allergy with vancomycin) -- clear liquid diet -- 3/10--advance as tolerated to soft today. D/c flagyl, cont Dificid, diarrhea decreased. No fever, BP stable --3/11 WBC 24K no fever, tolerating po meds and some po diet  Ischemic colitis Noted on imaging studies -On physical exam patient is non tender -Findings may be secondary to low flow state from hypotension -- patient was seen by vascular surgery Dr. Wyn Quaker. She has celiac and IMA  vessel stenosis which likely not contributing to any of the current symptoms. No vascular procedure recommended.  Functional quadriplegia/Bed bound status --Secondary to advanced dementia and adult failure to thrive --Patient requires assistance with all activities of daily living, bed bound --patient is long-term resident at [facility] -seen by Palliative care--will have hospice follow at the facility per discussion with fmally  Hypertension -- home meds on hold losartan, HCTZ and amlodipine at this time due to hypotension -- 3/10--blood pressure stable today --start amlodipine for now  acute renal failure superimposed on CKD stage IIIA mild dehydration -Secondary to poor oral intake, aiarrhea and  and concomitant diuretic therapy Baseline serum creatinine is about 1.02 on admission it is 1.46 --creat down to 1.02--0.9 --d/c IVF   DVT prophylaxis: Lovenox Code Status: DO NOT RESUSCITATE prior to admission Family Communication:  left message for son Burt Knack  disposition Plan: Back to [facility] environment[facility] Consults called: None Status: inpatient PLevel of care: Med-Surg Status is: Inpatient  Remains inpatient  appropriate because Being treated for C. difficile colitis, acute renal failure, failure to thrive with poor PO  Dispo: The patient is from: SNF              Anticipated d/c is to: SNF              Patient currently is not medically stable to d/c.   Difficult to place patient No  Patient continues to show improvement clinically. Still has poor PO intake. Continue to monitor labs with CBC tomorrow and replace electrolytes. If continues to show improvement likely discharge Saturday with hospice back to her long-term facility.     TOTAL TIME TAKING CARE OF THIS PATIENT: 25 minutes.  >50% time spent on counselling and coordination of care  Note: This dictation was prepared with Dragon dictation along with smaller phrase technology. Any transcriptional errors that result from this process are unintentional.  Andrea Barker M.D    [facility]   CC: Primary care physician; Housecalls, Doctors MakingPatient ID: Andrea Barker, female   DOB: [birth year], 80 y.o.   MRN: [id number]

## 2020-08-11 NOTE — [place] FL2 (Signed)
[place] MEDICAID FL2 LEVEL OF CARE SCREENING TOOL     IDENTIFICATION  Patient Name: Andrea Barker Birthdate: Dec 13, 1940 Sex: female Admission Date (Current Location): 08/08/2020  [place] and [place] Number:  Chiropodist and Address:  [facility], [address], [place], [place] [address]      Provider Number: [id number]  Attending Physician Name and Address:  Enedina Finner, MD  Relative Name and Phone Number:  Carter,Todd Shari Heritage)   [phone] [facility]);Carter,Sharon Relative     [phone] Daughter in law    Current Level of Care: Hospital Recommended Level of Care: Skilled Nursing Facility [facility]) Prior Approval Number:    Date Approved/Denied:   PASRR Number: [id number]  Discharge Plan: SNF [facility])    Current Diagnoses: Patient Active Problem List   Diagnosis Date Noted  . Severe sepsis (HCC)   . Clostridium difficile colitis   . Functional quadriplegia (HCC) 08/08/2020  . Altered mental status 07/31/2020  . Failure to thrive in adult 07/31/2020  . Essential hypertension 07/31/2020  . CKD (chronic kidney disease), stage III (HCC) 07/31/2020  . Debility 07/31/2020    Orientation RESPIRATION BLADDER Height & Weight      (Answers some questions, Dementia)  Normal Incontinent,External catheter Weight: 58 kg Height:  5\' 5"  (165.1 cm)  BEHAVIORAL SYMPTOMS/MOOD NEUROLOGICAL BOWEL NUTRITION STATUS     (Dementia) Incontinent Diet (Regular, nees assistance with feeding)  AMBULATORY STATUS COMMUNICATION OF NEEDS Skin   Total Care Verbally PU Stage and Appropriate Care (Contact Dermatitis arms and legs.  Buttocks with dressing)                       Personal Care Assistance Level of Assistance  Total care       Total Care Assistance: Maximum assistance (Bed Bound)   Functional Limitations Info  Sight,Hearing,Speech Sight Info: Adequate Hearing Info: Adequate Speech Info: Adequate (Answers some questions)     SPECIAL CARE FACTORS FREQUENCY                       Contractures Contractures Info: Not present    Additional Factors Info  Code Status,Allergies Code Status Info: DNR Allergies Info: Avelox (Moxifloxacin Hcl In Nacl), Calamine, Clindamycin, Clindamycin/lincomycin, Doxepin, Fluorouracil, Indomethacin, Levaquin (Levofloxacin), Minocycline, Penicillins, Sulfa Antibiotics, Vancomycin, Latex, Neomycin-bacitracin Zn-polymyx           Current Medications (08/11/2020):  This is the current hospital active medication list Current Facility-Administered Medications  Medication Dose Route Frequency Provider Last Rate Last Admin  . 0.9 %  sodium chloride infusion  250 mL Intravenous Continuous 10/11/2020, MD   Stopped at 08/10/20 1400  . 0.9 % NaCl with KCl 20 mEq/ L  infusion   Intravenous Continuous 10/10/20, MD 75 mL/hr at 08/11/20 1327 New Bag at 08/11/20 1327  . acetaminophen (TYLENOL) tablet 1,000 mg  1,000 mg Oral Q6H PRN Agbata, Tochukwu, MD   1,000 mg at 08/09/20 2327  . amLODipine (NORVASC) tablet 10 mg  10 mg Oral Daily 2328, MD   10 mg at 08/11/20 0902  . Chlorhexidine Gluconate Cloth 2 % PADS 6 each  6 each Topical Daily Agbata, Tochukwu, MD   6 each at 08/11/20 0927  . enoxaparin (LOVENOX) injection 40 mg  40 mg Subcutaneous Q24H 10/11/20, RPH   40 mg at 08/10/20 2358  . fidaxomicin (DIFICID) tablet 200 mg  200 mg Oral BID 10/10/20, MD   200 mg  at 08/11/20 0902  . Gerhardt's butt cream   Topical PRN Enedina Finner, MD      . mupirocin ointment (BACTROBAN) 2 % 1 application  1 application Nasal BID Agbata, Tochukwu, MD   1 application at 08/11/20 0902  . ondansetron (ZOFRAN) tablet 4 mg  4 mg Oral Q6H PRN Agbata, Tochukwu, MD       Or  . ondansetron (ZOFRAN) injection 4 mg  4 mg Intravenous Q6H PRN Agbata, Tochukwu, MD      . pantoprazole (PROTONIX) EC tablet 40 mg  40 mg Oral Daily Enedina Finner, MD   40 mg at 08/11/20 0903  . phosphorus (K PHOS  NEUTRAL) tablet 500 mg  500 mg Oral QID Martyn Malay, RPH   500 mg at 08/11/20 1513  . potassium chloride 10 mEq in 100 mL IVPB  10 mEq Intravenous Q1 Hr x 5 Martyn Malay, RPH   Stopped at 08/11/20 1512  . sertraline (ZOLOFT) tablet 50 mg  50 mg Oral QHS Enedina Finner, MD   50 mg at 08/10/20 2357     Discharge Medications: Please see discharge summary for a list of discharge medications.  Relevant Imaging Results:  Relevant Lab Results:   Additional Information SSN [id number]  Caryn Section, RN [phone]

## 2020-08-11 NOTE — Progress Notes (Signed)
PHARMACY CONSULT NOTE  Pharmacy Consult for Electrolyte Monitoring and Replacement   Recent Labs: Potassium (mmol/L)  Date Value  08/11/2020 3.1 (L)   Magnesium (mg/dL)  Date Value  33/82/5053 1.9   Calcium (mg/dL)  Date Value  97/67/3419 7.9 (L)   Albumin (g/dL)  Date Value  37/90/2409 2.1 (L)   Phosphorus (mg/dL)  Date Value  73/53/2992 1.6 (L)   Sodium (mmol/L)  Date Value  08/11/2020 138   Corrected Ca: 8.8 mg/dL  Assessment: 80 y.o. female admitted on 08/08/2020 with sepsis. Pt presented with AMS. Pt recently discharged from admission for UTI 2/28-3/3. PMH includes dementia, HTN, CKD, venous stasis dermatitis, chronic back pain, and chronic respiratory failure on 2L O2.  MIVF: off LR  K: 3.0>3.1 Phos: 1.6 Mg: 2.1>1.9  Goal of Therapy:  Electrolytes WNL  Plan:   K: 3>3.1 after IV on 3/10; will increase to IV q1h x5 today (plus 8.21meq from KPhos supplement).  Phos: 1.6 today; will order 500mg  KPhos QID x4 doses (~83mmol phos / 8.32meq K+)  Mg: trending down again; will give MagSulf 1g x1  Re-check electrolytes in AM  4m ,PharmD Clinical Pharmacist 08/11/2020 7:40 AM

## 2020-08-11 NOTE — Progress Notes (Addendum)
[facility] Room 142 [facility]) Hospital Liaison RN note:  Spoke with DIL, Jasmine December over the phone to provide update on patient. Plan is for discharge back to [facility] Monday if patient is medically ready.   Please send signed and completed DNR home with patient and provide any prescriptions as needed for ongoing symptom management.  Please call with any hospice related questions or concerns.  Thank you for the opportunity to participate in this patient's care.  Cyndra Numbers, RN [facility] Hospital Liaison [phone]

## 2020-08-12 ENCOUNTER — Inpatient Hospital Stay: Payer: Medicare Other

## 2020-08-12 DIAGNOSIS — A0472 Enterocolitis due to Clostridium difficile, not specified as recurrent: Secondary | ICD-10-CM | POA: Diagnosis not present

## 2020-08-12 DIAGNOSIS — R5381 Other malaise: Secondary | ICD-10-CM | POA: Diagnosis not present

## 2020-08-12 DIAGNOSIS — N1831 Chronic kidney disease, stage 3a: Secondary | ICD-10-CM | POA: Diagnosis not present

## 2020-08-12 DIAGNOSIS — A419 Sepsis, unspecified organism: Secondary | ICD-10-CM | POA: Diagnosis not present

## 2020-08-12 NOTE — Progress Notes (Signed)
PHARMACY CONSULT NOTE  Pharmacy Consult for Electrolyte Monitoring and Replacement   Recent Labs: Potassium (mmol/L)  Date Value  08/11/2020 3.1 (L)   Magnesium (mg/dL)  Date Value  28/41/3244 1.9   Calcium (mg/dL)  Date Value  06/05/7251 7.9 (L)   Albumin (g/dL)  Date Value  66/44/0347 2.1 (L)   Phosphorus (mg/dL)  Date Value  42/59/5638 1.6 (L)   Sodium (mmol/L)  Date Value  08/11/2020 138   Corrected Ca: 8.8 mg/dL  Assessment: 80 y.o. female admitted on 08/08/2020 with sepsis. Pt presented with AMS. Pt recently discharged from admission for UTI 2/28-3/3. PMH includes dementia, HTN, CKD, venous stasis dermatitis, chronic back pain, and chronic respiratory failure on 2L O2.  MIVF: NS 20 K  Goal of Therapy:  Electrolytes WNL  Plan:   Patient on NS 20 K at 75 ml/hr.  Appears patient refused lab draws this morning.  Continue to follow plan of care.  Pricilla Riffle ,PharmD Clinical Pharmacist 08/12/2020 7:41 AM

## 2020-08-12 NOTE — Progress Notes (Signed)
[facility]  - [place] at [facility]   PATIENT NAME: Andrea Barker    MR#:  [id number]  DATE OF BIRTH:  1940-09-15  SUBJECTIVE:  'I am tired" Very sleepyTolerating PO meds. But overall poor po intake Blood pressure ok  REVIEW OF SYSTEMS:   Review of Systems  Constitutional: Negative for chills, fever and weight loss.  HENT: Negative for ear discharge, ear pain and nosebleeds.   Eyes: Negative for blurred vision, pain and discharge.  Respiratory: Negative for sputum production, shortness of breath, wheezing and stridor.   Cardiovascular: Negative for chest pain, palpitations, orthopnea and PND.  Gastrointestinal: Positive for diarrhea. Negative for abdominal pain, nausea and vomiting.  Genitourinary: Negative for frequency and urgency.  Musculoskeletal: Negative for back pain and joint pain.  Neurological: Positive for weakness. Negative for sensory change, speech change and focal weakness.  Psychiatric/Behavioral: Negative for depression and hallucinations. The patient is not nervous/anxious.    Tolerating Diet:some Tolerating PT: bed bound  DRUG ALLERGIES:   Allergies  Allergen Reactions  . Avelox [Moxifloxacin Hcl In Nacl] Swelling  . Calamine     RASH  . Clindamycin Diarrhea and Nausea Only  . Clindamycin/Lincomycin Nausea And Vomiting  . Doxepin     Other reaction(s): Diarrhea  . Fluorouracil     Other reaction(s): Rash  . Indomethacin Nausea And Vomiting  . Levaquin [Levofloxacin] Diarrhea  . Minocycline Diarrhea and Nausea Only  . Penicillins Swelling  . Sulfa Antibiotics Hives  . Vancomycin Swelling  . Latex Rash  . Neomycin-Bacitracin Zn-Polymyx Rash    VITALS:  Blood pressure (!) 145/70, pulse 92, temperature 98.3 F (36.8 C), resp. rate 20, height 5\' 5"  (1.651 m), weight 58 kg, SpO2 91 %.  PHYSICAL EXAMINATION:   Physical Exam appears weak and deconditioned GENERAL:  80 y.o.-year-old patient lying in the bed with no acute  distress.  LUNGS: Normal breath sounds bilaterally, no wheezing, rales, rhonchi. No use of accessory muscles of respiration.  CARDIOVASCULAR: S1, S2 normal. No murmurs, rubs, or gallops.  ABDOMEN: Soft, nontender, mildly distended. Bowel sounds present. EXTREMITIES: No cyanosis, clubbing or edema b/l.    NEUROLOGIC: nonfocal. Moves all extremities well. PSYCHIATRIC:  patient is alert and awake SKIN: No obvious rash, lesion, or ulcer.   LABORATORY PANEL:  CBC Recent Labs  Lab 08/11/20 0548  WBC 24.7*  HGB 11.6*  HCT 35.4*  PLT 560*    Chemistries  Recent Labs  Lab 08/08/20 0742 08/09/20 0339 08/11/20 0548  NA 145   < > 138  K 3.5   < > 3.1*  CL 109   < > 107  CO2 23   < > 21*  GLUCOSE 144*   < > 89  BUN 35*   < > 14  CREATININE 1.46*   < > 0.86  CALCIUM 8.2*   < > 7.9*  MG  --    < > 1.9  AST 20  --   --   ALT 16  --   --   ALKPHOS 69  --   --   BILITOT 0.7  --   --    < > = values in this interval not displayed.   Cardiac Enzymes No results for input(s): TROPONINI in the last 168 hours. RADIOLOGY:  No results found. ASSESSMENT AND PLAN:   Andrea Barker is a 80 y.o. female with medical history significant for recent hospitalization for UTI, hypertension, chronic venous stasis GERD, dementia, chronic respiratory failure on 2  L of oxygen bedbound status who was sent to the patient for evaluation for possible sepsis.  Per EMS patient was hypoxic and her oxygen had to be increased to 4 L to maintain pulse oximetry greater than 92%.   Severe Sepsis (POA)/acute renal failure due to Clostridium difficile colitis Sepsis improved/resolved --As evidenced by hypotension requiring IV fluid resuscitation, marked leukocytosis as well as elevated lactic acid level --Source of sepsis appears to be possible Clostridium difficile colitis (ischemic to rule out C. difficile colitis --patient was recently hospitalized and discharged on March 3 for UTI and urine cultures yielded  Proteus mirabilis.  She was discharged back to the skilled nursing facility on Keflex to complete a 1 week course of therapy --recieved IV fluid resuscitation --3/9-- infectious disease consultation-- IV Flagyl and Dificid (some allergy with vancomycin) -- clear liquid diet -- 3/10--advance as tolerated to soft today. D/c flagyl, cont Dificid, diarrhea decreased. No fever, BP stable --3/11 WBC 24K no fever, tolerating po meds and some po diet --3/12--poor po intake. Wbc remains elevated. Xray abdomen today  Ischemic colitis Noted on imaging studies -Findings may be secondary to low flow state from hypotension -- patient was seen by vascular surgery Dr. Wyn Quaker. She has celiac and IMA  vessel stenosis which likely not contributing to any of the current symptoms. No vascular procedure recommended.  Functional quadriplegia/Bed bound status --Secondary to advanced dementia and adult failure to thrive --Patient requires assistance with all activities of daily living, bed bound --patient is [facility] -seen by Palliative care--will have hospice follow at the facility per discussion with fmally  Hypertension -- home meds on hold losartan, HCTZ and amlodipine at this time due to hypotension -- 3/10--blood pressure stable today --start amlodipine for now  acute renal failure superimposed on CKD stage IIIA mild dehydration -Secondary to poor oral intake, aiarrhea and  and concomitant diuretic therapy Baseline serum creatinine is about 1.02 on admission it is 1.46 --creat down to 1.02--0.9 --d/c IVF  Overall poor prognosis Seen by Palliative care--pt will be followed by hospice at her LTC  DVT prophylaxis: Lovenox Code Status: DO NOT RESUSCITATE prior to admission Family Communication:  left message for son Andrea Barker  disposition Plan: Back to previous home environment-- [facility] Consults called: None Status: inpatient PLevel of care: Med-Surg Status is:  Inpatient  Remains inpatient appropriate because Being treated for C. difficile colitis, acute renal failure, failure to thrive with poor PO  Dispo: The patient is from: SNF              Anticipated d/c is to: SNF              Patient currently is not medically stable to d/c.  Cont current rx. May need to restart IV flagyl if abd xray cont with dialted bowels    TOTAL TIME TAKING CARE OF THIS PATIENT: 25 minutes.  >50% time spent on counselling and coordination of care  Note: This dictation was prepared with Dragon dictation along with smaller phrase technology. Any transcriptional errors that result from this process are unintentional.  Enedina Finner M.D    [facility]   CC: Primary care physician; Housecalls, Doctors MakingPatient ID: Vanice Sarah, female   DOB: Sep 19, 1940, 80 y.o.   MRN: [id number]

## 2020-08-13 DIAGNOSIS — A0472 Enterocolitis due to Clostridium difficile, not specified as recurrent: Secondary | ICD-10-CM | POA: Diagnosis not present

## 2020-08-13 DIAGNOSIS — A419 Sepsis, unspecified organism: Secondary | ICD-10-CM | POA: Diagnosis not present

## 2020-08-13 DIAGNOSIS — R5381 Other malaise: Secondary | ICD-10-CM | POA: Diagnosis not present

## 2020-08-13 DIAGNOSIS — N1831 Chronic kidney disease, stage 3a: Secondary | ICD-10-CM | POA: Diagnosis not present

## 2020-08-13 LAB — CBC
HCT: 35.2 % — ABNORMAL LOW (ref 36.0–46.0)
Hemoglobin: 11.6 g/dL — ABNORMAL LOW (ref 12.0–15.0)
MCH: 29.2 pg (ref 26.0–34.0)
MCHC: 33 g/dL (ref 30.0–36.0)
MCV: 88.7 fL (ref 80.0–100.0)
Platelets: 521 10*3/uL — ABNORMAL HIGH (ref 150–400)
RBC: 3.97 MIL/uL (ref 3.87–5.11)
RDW: 14.3 % (ref 11.5–15.5)
WBC: 14 10*3/uL — ABNORMAL HIGH (ref 4.0–10.5)
nRBC: 0 % (ref 0.0–0.2)

## 2020-08-13 LAB — CULTURE, BLOOD (ROUTINE X 2)
Culture: NO GROWTH
Culture: NO GROWTH

## 2020-08-13 LAB — RESP PANEL BY RT-PCR (FLU A&B, COVID) ARPGX2
Influenza A by PCR: NEGATIVE
Influenza B by PCR: NEGATIVE
SARS Coronavirus 2 by RT PCR: NEGATIVE

## 2020-08-13 LAB — POTASSIUM: Potassium: 3.8 mmol/L (ref 3.5–5.1)

## 2020-08-13 MED ORDER — LOSARTAN POTASSIUM 50 MG PO TABS
100.0000 mg | ORAL_TABLET | Freq: Every day | ORAL | Status: DC
  Administered 2020-08-13 – 2020-08-14 (×2): 100 mg via ORAL
  Filled 2020-08-13 (×2): qty 2

## 2020-08-13 NOTE — Plan of Care (Signed)
  Problem: Clinical Measurements: Goal: Ability to maintain clinical measurements within normal limits will improve Outcome: Progressing   Problem: Clinical Measurements: Goal: Will remain free from infection Outcome: Progressing   Problem: Clinical Measurements: Goal: Diagnostic test results will improve Outcome: Progressing   Problem: Clinical Measurements: Goal: Respiratory complications will improve Outcome: Progressing   

## 2020-08-13 NOTE — Progress Notes (Signed)
[facility]  - [place] at [facility]   PATIENT NAME: Andrea Barker    MR#:  [id number]  DATE OF BIRTH:  1941/02/05  SUBJECTIVE:  More awake and alert today Appetite about the same REVIEW OF SYSTEMS:   Review of Systems  Constitutional: Negative for chills, fever and weight loss.  HENT: Negative for ear discharge, ear pain and nosebleeds.   Eyes: Negative for blurred vision, pain and discharge.  Respiratory: Negative for sputum production, shortness of breath, wheezing and stridor.   Cardiovascular: Negative for chest pain, palpitations, orthopnea and PND.  Gastrointestinal: Positive for diarrhea. Negative for abdominal pain, nausea and vomiting.  Genitourinary: Negative for frequency and urgency.  Musculoskeletal: Negative for back pain and joint pain.  Neurological: Positive for weakness. Negative for sensory change, speech change and focal weakness.  Psychiatric/Behavioral: Negative for depression and hallucinations. The patient is not nervous/anxious.    Tolerating Diet:some Tolerating PT: bed bound  DRUG ALLERGIES:   Allergies  Allergen Reactions  . Avelox [Moxifloxacin Hcl In Nacl] Swelling  . Calamine     RASH  . Clindamycin Diarrhea and Nausea Only  . Clindamycin/Lincomycin Nausea And Vomiting  . Doxepin     Other reaction(s): Diarrhea  . Fluorouracil     Other reaction(s): Rash  . Indomethacin Nausea And Vomiting  . Levaquin [Levofloxacin] Diarrhea  . Minocycline Diarrhea and Nausea Only  . Penicillins Swelling  . Sulfa Antibiotics Hives  . Vancomycin Swelling  . Latex Rash  . Neomycin-Bacitracin Zn-Polymyx Rash    VITALS:  Blood pressure (!) 168/79, pulse 83, temperature 97.6 F (36.4 C), temperature source Oral, resp. rate 20, height 5\' 5"  (1.651 m), weight 58 kg, SpO2 96 %.  PHYSICAL EXAMINATION:   Physical Exam appears weak and deconditioned GENERAL:  80 y.o.-year-old patient lying in the bed with no acute distress.  LUNGS:  Normal breath sounds bilaterally, no wheezing, rales, rhonchi. No use of accessory muscles of respiration.  CARDIOVASCULAR: S1, S2 normal. No murmurs, rubs, or gallops.  ABDOMEN: Soft, nontender, mildly distended. Bowel sounds present. EXTREMITIES: No cyanosis, clubbing or edema b/l.    NEUROLOGIC: nonfocal. Moves all extremities well. PSYCHIATRIC:  patient is alert and awake SKIN: No obvious rash, lesion, or ulcer.   LABORATORY PANEL:  CBC Recent Labs  Lab 08/13/20 0758  WBC 14.0*  HGB 11.6*  HCT 35.2*  PLT 521*    Chemistries  Recent Labs  Lab 08/08/20 0742 08/09/20 0339 08/11/20 0548 08/13/20 0758  NA 145   < > 138  --   K 3.5   < > 3.1* 3.8  CL 109   < > 107  --   CO2 23   < > 21*  --   GLUCOSE 144*   < > 89  --   BUN 35*   < > 14  --   CREATININE 1.46*   < > 0.86  --   CALCIUM 8.2*   < > 7.9*  --   MG  --    < > 1.9  --   AST 20  --   --   --   ALT 16  --   --   --   ALKPHOS 69  --   --   --   BILITOT 0.7  --   --   --    < > = values in this interval not displayed.   Cardiac Enzymes No results for input(s): TROPONINI in the last 168 hours. RADIOLOGY:  DG  Abd 1 View  Result Date: 08/12/2020 CLINICAL DATA:  Abdominal pain EXAM: ABDOMEN - 1 VIEW COMPARISON:  CT abdomen and pelvis August 08, 2020 FINDINGS: There is no appreciable bowel dilatation or air-fluid level to suggest bowel obstruction. No free air. Lung bases clear. 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 There is marked thoracolumbar levoscoliosis. Postoperative change in right proximal femur. IMPRESSION: No bowel dilatation. No findings suggesting ileus or bowel obstruction. No free air. Bones osteoporotic. Marked scoliosis. Electronically Signed   By: Bretta Bang III M.D.   On: 08/12/2020 14:02   ASSESSMENT AND PLAN:   Andrea Barker is a 80 y.o. female with medical history significant for recent hospitalization for UTI, hypertension, chronic venous stasis GERD, dementia, chronic respiratory failure on 2  L of oxygen bedbound status who was sent to the patient for evaluation for possible sepsis.  Per EMS patient was hypoxic and her oxygen had to be increased to 4 L to maintain pulse oximetry greater than 92%.   Severe Sepsis (POA)/acute renal failure due to Clostridium difficile colitis Sepsis improved/resolved --As evidenced by hypotension requiring IV fluid resuscitation, marked leukocytosis as well as elevated lactic acid level --Source of sepsis appears to be possible Clostridium difficile colitis (ischemic to rule out C. difficile colitis --patient was recently hospitalized and discharged on March 3 for UTI and urine cultures yielded Proteus mirabilis.  She was discharged back to the skilled nursing facility on Keflex to complete a 1 week course of therapy --recieved IV fluid resuscitation --3/9-- infectious disease consultation-- IV Flagyl and Dificid (some allergy with vancomycin) -- clear liquid diet -- 3/10--advance as tolerated to soft today. D/c flagyl, cont Dificid, diarrhea decreased. No fever, BP stable --3/11 WBC 24K no fever, tolerating po meds and some po diet --3/12--poor po intake. Wbc remains elevated. Xray abdomen today --3/13--more awake and talkative. WBC 14K, KUB no dilated loops  Ischemic colitis Noted on imaging studies --Findings may be secondary to low flow state from hypotension --patient was seen by vascular surgery Dr. Wyn Quaker. She has celiac and IMA  vessel stenosis which likely not contributing to any of the current symptoms. No vascular procedure recommended.  Functional quadriplegia/Bed bound status --Secondary to advanced dementia and adult failure to thrive --Patient requires assistance with all activities of daily living, bed bound --patient is [facility] resident [facility] --seen by Palliative care--will have hospice follow at the facility per discussion with fmally  Hypertension -- home meds on hold losartan, HCTZ and amlodipine at this time  due to hypotension -- 3/10--blood pressure stable today -- amlodipine for now --3/13--added home losartan  acute renal failure superimposed on CKD stage IIIA mild dehydration --Secondary to poor oral intake, aiarrhea and  and concomitant diuretic therapy Baseline serum creatinine is about 1.02 on admission it is 1.46 --creat down to 1.02-->0.9 --d/c IVF  Overall poor prognosis Seen by Palliative care--pt will be followed by hospice at her LTC  DVT prophylaxis: Lovenox Code Status: DO NOT RESUSCITATE prior to admission Family Communication:  spoke with Theodis Shove on 3/13 disposition Plan: Back to previous home environment-- [facility] on monday 3/14 with hospice to follow Consults called: None Status: inpatient PLevel of care: Med-Surg Status is: Inpatient  Remains inpatient appropriate because Being treated for C. difficile colitis, acute renal failure, failure to thrive with poor PO  Dispo: The patient is from: SNF              Anticipated d/c is to: SNF  Patient currently is medically improving     TOTAL TIME TAKING CARE OF THIS PATIENT: 25 minutes.  >50% time spent on counselling and coordination of care  Note: This dictation was prepared with Dragon dictation along with smaller phrase technology. Any transcriptional errors that result from this process are unintentional.  Enedina Finner M.D    [facility]   CC: Primary care physician; Housecalls, Doctors MakingPatient ID: Andrea Barker, female   DOB: 12-Oct-1940, 80 y.o.   MRN: [id number]

## 2020-08-13 NOTE — Progress Notes (Signed)
PHARMACY CONSULT NOTE  Pharmacy Consult for Electrolyte Monitoring and Replacement   Recent Labs: Potassium (mmol/L)  Date Value  08/13/2020 3.8   Magnesium (mg/dL)  Date Value  24/81/8590 1.9   Calcium (mg/dL)  Date Value  93/04/2161 7.9 (L)   Albumin (g/dL)  Date Value  44/69/5072 2.1 (L)   Phosphorus (mg/dL)  Date Value  25/75/0518 1.6 (L)   Sodium (mmol/L)  Date Value  08/11/2020 138    Assessment: 80 y.o. female admitted on 08/08/2020 with sepsis. Pt presented with AMS. Pt recently discharged from admission for UTI 2/28-3/3. PMH includes dementia, HTN, CKD, venous stasis dermatitis, chronic back pain, and chronic respiratory failure on 2L O2.  MIVF: NS 20 K  Goal of Therapy:  Electrolytes WNL  Plan:   Patient on NS 20 K at 75 ml/hr.  Potassium 3.8 today, stable  Continue to follow plan of care.  Pricilla Riffle ,PharmD Clinical Pharmacist 08/13/2020 8:18 AM

## 2020-08-14 DIAGNOSIS — A419 Sepsis, unspecified organism: Secondary | ICD-10-CM | POA: Diagnosis not present

## 2020-08-14 DIAGNOSIS — N183 Chronic kidney disease, stage 3 unspecified: Secondary | ICD-10-CM

## 2020-08-14 DIAGNOSIS — N1831 Chronic kidney disease, stage 3a: Secondary | ICD-10-CM | POA: Diagnosis not present

## 2020-08-14 DIAGNOSIS — G9340 Encephalopathy, unspecified: Secondary | ICD-10-CM | POA: Diagnosis not present

## 2020-08-14 DIAGNOSIS — I10 Essential (primary) hypertension: Secondary | ICD-10-CM | POA: Diagnosis not present

## 2020-08-14 DIAGNOSIS — R627 Adult failure to thrive: Secondary | ICD-10-CM

## 2020-08-14 DIAGNOSIS — R400 Somnolence: Secondary | ICD-10-CM | POA: Diagnosis not present

## 2020-08-14 DIAGNOSIS — R4182 Altered mental status, unspecified: Secondary | ICD-10-CM

## 2020-08-14 DIAGNOSIS — A0472 Enterocolitis due to Clostridium difficile, not specified as recurrent: Secondary | ICD-10-CM | POA: Diagnosis not present

## 2020-08-14 MED ORDER — FIDAXOMICIN 200 MG PO TABS: 200.0000 mg | ORAL_TABLET | Freq: Two times a day (BID) | ORAL | Status: AC

## 2020-08-14 NOTE — Discharge Summary (Signed)
[place] at [facility]   PATIENT NAME: Andrea Barker    MR#:  [id number]  DATE OF BIRTH:  1940-12-13  DATE OF ADMISSION:  08/08/2020   ADMITTING PHYSICIAN: Lucile Shutters, MD  DATE OF DISCHARGE: 08/14/2020  PRIMARY CARE PHYSICIAN: [facility]   ADMISSION DIAGNOSIS:  Ischemic colitis (HCC) [K55.9] Sepsis (HCC) [A41.9] Community acquired pneumonia, unspecified laterality [J18.9] Sepsis, due to unspecified organism, unspecified whether acute organ dysfunction present (HCC) [A41.9] DISCHARGE DIAGNOSIS:  Active Problems:   Altered mental status   Failure to thrive in adult   Essential hypertension   CKD (chronic kidney disease), stage III (HCC)   Debility   Functional quadriplegia (HCC)   Severe sepsis (HCC)   Clostridium difficile colitis  SECONDARY DIAGNOSIS:   Past Medical History:  Diagnosis Date  . Anxiety   . Arthritis   . GERD (gastroesophageal reflux disease)   . Hypertension   . Seasonal allergic rhinitis    HOSPITAL COURSE:  Andrea Egert Valentineis a 80 y.o.femalewith medical history significant forrecent hospitalization for UTI, hypertension, chronic venous stasis GERD, dementia, chronic respiratory failure on 2 L of oxygen bedbound status who was sent to the patient for evaluation for possible sepsis.   Severe Sepsis(POA)/acute renal failure due to Clostridium difficile colitis - present on admission Sepsis improved/resolved -Source of infection Clostridium difficile colitis  Ischemic colitis Noted on imaging studies --Findings may be secondary to low flow state from hypotension --patient was seen by vascular surgery Dr. Wyn Quaker. She has celiac and IMA  vessel stenosis which likely not contributing to any of the current symptoms. No vascular procedure recommended.  Functional quadriplegia/Bed bound status --Secondary to advanced dementia and adult failure to thrive --Patient requires assistance with all activities of daily  living, bed bound --patient [facility] --[facility]  Hypertension -- resume amlodipine at D/C  acute renal failure superimposed on CKD stage IIIA mild dehydration --Secondary to poor oral intake, diarrhea and  and concomitant diuretic therapy - back to baseline.  Overall poor prognosis and is [facility] to [facility] with H[facility].  DISCHARGE CONDITIONS:  fair CONSULTS OBTAINED:   DRUG ALLERGIES:   Allergies  Allergen Reactions  . Avelox [Moxifloxacin Hcl In Nacl] Swelling  . Calamine     RASH  . Clindamycin Diarrhea and Nausea Only  . Clindamycin/Lincomycin Nausea And Vomiting  . Doxepin     Other reaction(s): Diarrhea  . Fluorouracil     Other reaction(s): Rash  . Indomethacin Nausea And Vomiting  . Levaquin [Levofloxacin] Diarrhea  . Minocycline Diarrhea and Nausea Only  . Penicillins Swelling  . Sulfa Antibiotics Hives  . Vancomycin Swelling  . Latex Rash  . Neomycin-Bacitracin Zn-Polymyx Rash   DISCHARGE MEDICATIONS:   Allergies as of 08/14/2020      Reactions   Avelox [moxifloxacin Hcl In Nacl] Swelling   Calamine    RASH   Clindamycin Diarrhea, Nausea Only   Clindamycin/lincomycin Nausea And Vomiting   Doxepin    Other reaction(s): Diarrhea   Fluorouracil    Other reaction(s): Rash   Indomethacin Nausea And Vomiting   Levaquin [levofloxacin] Diarrhea   Minocycline Diarrhea, Nausea Only   Penicillins Swelling   Sulfa Antibiotics Hives   Vancomycin Swelling   Latex Rash   Neomycin-bacitracin Zn-polymyx Rash      Medication List    STOP taking these medications   acetaminophen 650 MG CR tablet Commonly known as: TYLENOL   cetirizine 10 MG  tablet Commonly known as: ZYRTEC   diclofenac Sodium 1 % Gel Commonly known as: VOLTAREN   diphenhydrAMINE 2 % cream Commonly known as: BENADRYL   docusate sodium 100 MG capsule Commonly known as: COLACE   esomeprazole 20 MG capsule Commonly known as:  NEXIUM   fentaNYL 100 MCG/HR Commonly known as: DURAGESIC   fluticasone 50 MCG/ACT nasal spray Commonly known as: FLONASE   gabapentin 300 MG capsule Commonly known as: NEURONTIN   hydrochlorothiazide 12.5 MG tablet Commonly known as: HYDRODIURIL   lidocaine 5 % Commonly known as: LIDODERM   losartan 100 MG tablet Commonly known as: COZAAR   magnesium oxide 400 MG tablet Commonly known as: MAG-OX   multivitamin with minerals tablet   mupirocin ointment 2 % Commonly known as: BACTROBAN   nystatin cream Commonly known as: MYCOSTATIN   Oxycodone HCl 10 MG Tabs   phenol 1.4 % Liqd Commonly known as: CHLORASEPTIC   polyethylene glycol 17 g packet Commonly known as: MIRALAX / GLYCOLAX   promethazine 25 MG tablet Commonly known as: PHENERGAN   vitamin C 100 MG tablet     TAKE these medications   amLODipine 10 MG tablet Commonly known as: NORVASC Take 10 mg by mouth daily.   fidaxomicin 200 MG Tabs tablet Commonly known as: DIFICID Take 1 tablet (200 mg total) by mouth 2 (two) times daily for 5 days.   sertraline 50 MG tablet Commonly known as: ZOLOFT Take 50 mg by mouth at bedtime.      DISCHARGE INSTRUCTIONS:   DIET:  Regular diet DISCHARGE CONDITION:  Fair ACTIVITY:  Activity as tolerated OXYGEN:  Home Oxygen: Yes.    Oxygen Delivery: 2 liters/min via Patient connected to nasal cannula oxygen DISCHARGE LOCATION:  nursing home/[facility] with Hospice   If you experience worsening of your admission symptoms, develop shortness of breath, life threatening emergency, suicidal or homicidal thoughts you must seek medical attention immediately by calling 911 or calling your MD immediately  if symptoms less severe.  You Must read complete instructions/literature along with all the possible adverse reactions/side effects for all the Medicines you take and that have been prescribed to you. Take any new Medicines after you have completely understood and  accpet all the possible adverse reactions/side effects.   Please note  You were cared for by a hospitalist during your hospital stay. If you have any questions about your discharge medications or the care you received while you were in the hospital after you are discharged, you can call the unit and asked to speak with the hospitalist on call if the hospitalist that took care of you is not available. Once you are discharged, your primary care physician will handle any further medical issues. Please note that NO REFILLS for any discharge medications will be authorized once you are discharged, as it is imperative that you return to your primary care physician (or establish a relationship with a primary care physician if you do not have one) for your aftercare needs so that they can reassess your need for medications and monitor your lab values.    On the day of Discharge:  VITAL SIGNS:  Blood pressure (!) 157/78, pulse 85, temperature 98 F (36.7 C), resp. rate 20, height 5\' 5"  (1.651 m), weight 58 kg, SpO2 95 %. PHYSICAL EXAMINATION:  GENERAL:  80 y.o.-year-old patient lying in the bed with no acute distress. Severely cacehctic EYES: Pupils equal, round, reactive to light and accommodation. No scleral icterus. Extraocular muscles intact.  HEENT: Head atraumatic, normocephalic. Oropharynx and nasopharynx clear.  NECK:  Supple, no jugular venous distention. No thyroid enlargement, no tenderness.  LUNGS: Normal breath sounds bilaterally, no wheezing, rales,rhonchi or crepitation. No use of accessory muscles of respiration.  CARDIOVASCULAR: S1, S2 normal. No murmurs, rubs, or gallops.  ABDOMEN: Soft, non-tender, non-distended. Bowel sounds present. No organomegaly or mass.  EXTREMITIES: No pedal edema, cyanosis, or clubbing.  NEUROLOGIC: Cranial nerves II through XII are intact. Sensation intact. Gait not checked.  PSYCHIATRIC: The patient is alert SKIN: No obvious rash, lesion, or ulcer.  DATA  REVIEW:   CBC Recent Labs  Lab 08/13/20 0758  WBC 14.0*  HGB 11.6*  HCT 35.2*  PLT 521*    Chemistries  Recent Labs  Lab 08/08/20 0742 08/09/20 0339 08/11/20 0548 08/13/20 0758  NA 145   < > 138  --   K 3.5   < > 3.1* 3.8  CL 109   < > 107  --   CO2 23   < > 21*  --   GLUCOSE 144*   < > 89  --   BUN 35*   < > 14  --   CREATININE 1.46*   < > 0.86  --   CALCIUM 8.2*   < > 7.9*  --   MG  --    < > 1.9  --   AST 20  --   --   --   ALT 16  --   --   --   ALKPHOS 69  --   --   --   BILITOT 0.7  --   --   --    < > = values in this interval not displayed.     Outpatient follow-up  Contact information for after-discharge care    Destination    [facility] Preferred SNF .   Service: Skilled Nursing Contact information: [address] [place] [address] [phone]                  30 Day Unplanned Readmission Risk Score   Flowsheet Row ED to Hosp-Admission (Current) from 08/08/2020 in [facility] (1A)  30 Day Unplanned Readmission Risk Score (%) 14.91 Filed at 08/14/2020 0400     This score is the patient's risk of an unplanned readmission within 30 days of being discharged (0 -100%). The score is based on dignosis, age, lab data, medications, orders, and past utilization.   Low:  0-14.9   Medium: 15-21.9   High: 22-29.9   Extreme: 30 and above         Management plans discussed with the patient, nursing and they are in agreement.  CODE STATUS: DNR   TOTAL TIME TAKING CARE OF THIS PATIENT: 45 minutes.    Delfino Lovett M.D on 08/14/2020 at 7:39 AM  [facility]   CC: Primary care physician; [facility]   Note: This dictation was prepared with Dragon dictation along with smaller phrase technology. Any transcriptional errors that result from this process are unintentional.

## 2020-08-14 NOTE — TOC Progression Note (Signed)
Transition of Care [facility]) - Progression Note    Patient Details  Name: Andrea Barker MRN: [id number] Date of Birth: [birth year]  Transition of Care [facility] CM/SW Contact  Caryn Section, RN Phone Number: 08/14/2020, 10:01 AM  Clinical Narrative:  Left message for [facility] Admissions Coordinator RE: discharge today.  Awaiting return call.          Expected Discharge Plan and Services           Expected Discharge Date: 08/14/20                                     Social Determinants of Health (SDOH) Interventions    Readmission Risk Interventions No flowsheet data found.

## 2020-08-14 NOTE — Progress Notes (Signed)
Patient is stable and ready for discharge going [facility] to [facility]. Patient's IVs removed. Patient's VSS and pericare provided prior to discharge. Writer called report and spoke with receiving nurse ernestine and she verbalized understanding. Patient transported via EMS.

## 2020-08-14 NOTE — Care Management Important Message (Signed)
Important Message  Patient Details  Name: Andrea Barker MRN: [id number] Date of Birth: 06-20-1940   Medicare Important Message Given:  Yes  I talked with son, Burt Knack and DIL Jasmine December at [phone] and they were in agreement with the discharge plan for today.  Asked if they would like another copy of the Important Message from Medicare and they replied no as I they had the copy from Friday that I emailed them.  I thanked them for their time.  Olegario Messier A Hamsa Laurich 08/14/2020, 11:09 AM

## 2020-08-14 NOTE — Progress Notes (Signed)
   Date of Admission:  08/08/2020     ID: Andrea Barker is a 80 y.o. female  Active Problems:   Altered mental status   Failure to thrive in adult   Essential hypertension   CKD (chronic kidney disease), stage III (HCC)   Debility   Functional quadriplegia (HCC)   Severe sepsis (HCC)   Clostridium difficile colitis    Subjective: Patient is doing much better Says no pain abdomen or diarrhea Appetite better  Medications:  . amLODipine  10 mg Oral Daily  . Chlorhexidine Gluconate Cloth  6 each Topical Daily  . enoxaparin (LOVENOX) injection  40 mg Subcutaneous Q24H  . fidaxomicin  200 mg Oral BID  . losartan  100 mg Oral Daily  . pantoprazole  40 mg Oral Daily  . sertraline  50 mg Oral QHS    Objective: Vital signs in last 24 hours: Temp:  [97.5 F (36.4 C)-98.5 F (36.9 C)] 98.3 F (36.8 C) (03/14 0909) Pulse Rate:  [83-95] 91 (03/14 0909) Resp:  [15-20] 17 (03/14 0909) BP: (146-157)/(70-87) 150/85 (03/14 0909) SpO2:  [92 %-95 %] 93 % (03/14 0909)  PHYSICAL EXAM:  General: Alert, cooperative, no distress, appears stated age.  Head: Normocephalic, without obvious abnormality, atraumatic. Eyes: Conjunctivae clear, anicteric sclerae. Pupils are equal ENT Nares normal. No drainage or sinus tenderness. Lips, mucosa, and tongue normal. No Thrush Neck: Supple, symmetrical, no adenopathy, thyroid: non tender no carotid bruit and no JVD. Back: No CVA tenderness. Lungs: Clear to auscultation bilaterally. No Wheezing or Rhonchi. No rales. Heart: Regular rate and rhythm, no murmur, rub or gallop. Abdomen: Soft, non-tender,not distended. Bowel sounds normal. No masses Neurologic: Did not assess in detail  Lab Results Recent Labs    08/13/20 0758  WBC 14.0*  HGB 11.6*  HCT 35.2*  K 3.8   Liver Panel No results for input(s): PROT, ALBUMIN, AST, ALT, ALKPHOS, BILITOT, BILIDIR, IBILI in the last 72 hours. Sedimentation Rate No results for input(s): ESRSEDRATE in the  last 72 hours. C-Reactive Protein No results for input(s): CRP in the last 72 hours.  Microbiology:  Studies/Results: DG Abd 1 View  Result Date: 08/12/2020 CLINICAL DATA:  Abdominal pain EXAM: ABDOMEN - 1 VIEW COMPARISON:  CT abdomen and pelvis August 08, 2020 FINDINGS: There is no appreciable bowel dilatation or air-fluid level to suggest bowel obstruction. No free air. Lung bases clear. 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 There is marked thoracolumbar levoscoliosis. Postoperative change in right proximal femur. IMPRESSION: No bowel dilatation. No findings suggesting ileus or bowel obstruction. No free air. Bones osteoporotic. Marked scoliosis. Electronically Signed   By: Bretta Bang III M.D.   On: 08/12/2020 14:02     Assessment/Plan: Cdiff colitis-doing much better.  Leucocytosis  Improving- continue fidoxamicin for a total of 10 days - end date 08/20/20  Encephalopathy has resolved  ID will sign off.  Call if needed

## 2020-08-14 NOTE — TOC Transition Note (Signed)
Transition of Care [facility]) - CM/SW Discharge Note   Patient Details  Name: Andrea Barker MRN: [id number] Date of Birth: Dec 25, 1940  [facility] CM/SW Contact:  Caryn Section, RN Phone Number: 08/14/2020, 12:41 PM   Clinical Narrative:   [facility] notified patient daughter of transfer to [facility] today, amenable.  [facility] contacted, will be transporting patient, 3rd on this list at this time.  RN has already called report to facility.  No further concerns.  TOC signing off.    Final next level of care: Skilled Nursing Facility Barriers to Discharge: Barriers Resolved   Patient Goals and CMS Choice     Choice offered to / list presented to : NA  Discharge Placement PASRR number recieved: 08/11/20            Patient chooses bed at: [facility]  Patient to be transferred to facility by:  EMS Name of family member notified: Theodis Shove Patient and family notified of of transfer: 08/14/20  Discharge Plan and Services                                     Social Determinants of Health (SDOH) Interventions     Readmission Risk Interventions No flowsheet data found.

## 2020-09-12 ENCOUNTER — Emergency Department

## 2020-09-12 ENCOUNTER — Emergency Department
Admission: EM | Admit: 2020-09-12 | Discharge: 2020-09-12 | Disposition: A | Discharge status: Home / Self Care | Attending: Emergency Medicine | Admitting: Emergency Medicine

## 2020-09-12 ENCOUNTER — Other Ambulatory Visit: Payer: Self-pay

## 2020-09-12 DIAGNOSIS — I7101 Dissection of thoracic aorta: Secondary | ICD-10-CM | POA: Insufficient documentation

## 2020-09-12 DIAGNOSIS — R1013 Epigastric pain: Secondary | ICD-10-CM | POA: Insufficient documentation

## 2020-09-12 DIAGNOSIS — Z87891 Personal history of nicotine dependence: Secondary | ICD-10-CM | POA: Diagnosis not present

## 2020-09-12 DIAGNOSIS — I71019 Dissection of thoracic aorta, unspecified: Secondary | ICD-10-CM

## 2020-09-12 DIAGNOSIS — N183 Chronic kidney disease, stage 3 unspecified: Secondary | ICD-10-CM | POA: Insufficient documentation

## 2020-09-12 DIAGNOSIS — I7779 Dissection of other artery: Secondary | ICD-10-CM | POA: Insufficient documentation

## 2020-09-12 DIAGNOSIS — R410 Disorientation, unspecified: Secondary | ICD-10-CM | POA: Diagnosis not present

## 2020-09-12 DIAGNOSIS — K862 Cyst of pancreas: Secondary | ICD-10-CM

## 2020-09-12 DIAGNOSIS — Z7189 Other specified counseling: Secondary | ICD-10-CM | POA: Diagnosis not present

## 2020-09-12 DIAGNOSIS — R0789 Other chest pain: Secondary | ICD-10-CM | POA: Diagnosis present

## 2020-09-12 DIAGNOSIS — Z91040 Latex allergy status: Secondary | ICD-10-CM | POA: Diagnosis not present

## 2020-09-12 DIAGNOSIS — Z79899 Other long term (current) drug therapy: Secondary | ICD-10-CM | POA: Insufficient documentation

## 2020-09-12 DIAGNOSIS — R079 Chest pain, unspecified: Secondary | ICD-10-CM

## 2020-09-12 DIAGNOSIS — I129 Hypertensive chronic kidney disease with stage 1 through stage 4 chronic kidney disease, or unspecified chronic kidney disease: Secondary | ICD-10-CM | POA: Diagnosis not present

## 2020-09-12 DIAGNOSIS — F0390 Unspecified dementia without behavioral disturbance: Secondary | ICD-10-CM | POA: Insufficient documentation

## 2020-09-12 DIAGNOSIS — I700 Atherosclerosis of aorta: Secondary | ICD-10-CM | POA: Diagnosis not present

## 2020-09-12 DIAGNOSIS — K860 Alcohol-induced chronic pancreatitis: Secondary | ICD-10-CM | POA: Insufficient documentation

## 2020-09-12 LAB — COMPREHENSIVE METABOLIC PANEL
ALT: 12 U/L (ref 0–44)
AST: 14 U/L — ABNORMAL LOW (ref 15–41)
Albumin: 2.7 g/dL — ABNORMAL LOW (ref 3.5–5.0)
Alkaline Phosphatase: 67 U/L (ref 38–126)
Anion gap: 9 (ref 5–15)
BUN: 25 mg/dL — ABNORMAL HIGH (ref 8–23)
CO2: 20 mmol/L — ABNORMAL LOW (ref 22–32)
Calcium: 8.4 mg/dL — ABNORMAL LOW (ref 8.9–10.3)
Chloride: 107 mmol/L (ref 98–111)
Creatinine, Ser: 0.81 mg/dL (ref 0.44–1.00)
GFR, Estimated: >60 mL/min (ref >60)
Glucose, Bld: 134 mg/dL — ABNORMAL HIGH (ref 70–99)
Potassium: 3.6 mmol/L (ref 3.5–5.1)
Sodium: 136 mmol/L (ref 135–145)
Total Bilirubin: 0.6 mg/dL (ref 0.3–1.2)
Total Protein: 6.8 g/dL (ref 6.5–8.1)

## 2020-09-12 LAB — CBC WITH DIFFERENTIAL/PLATELET
Abs Immature Granulocytes: 0.05 10*3/uL (ref 0.00–0.07)
Basophils Absolute: 0.1 10*3/uL (ref 0.0–0.1)
Basophils Relative: 1 %
Eosinophils Absolute: 0.3 10*3/uL (ref 0.0–0.5)
Eosinophils Relative: 2 %
HCT: 35.1 % — ABNORMAL LOW (ref 36.0–46.0)
Hemoglobin: 11.2 g/dL — ABNORMAL LOW (ref 12.0–15.0)
Immature Granulocytes: 0 %
Lymphocytes Relative: 17 %
Lymphs Abs: 2 10*3/uL (ref 0.7–4.0)
MCH: 29.8 pg (ref 26.0–34.0)
MCHC: 31.9 g/dL (ref 30.0–36.0)
MCV: 93.4 fL (ref 80.0–100.0)
Monocytes Absolute: 0.7 10*3/uL (ref 0.1–1.0)
Monocytes Relative: 6 %
Neutro Abs: 8.9 10*3/uL — ABNORMAL HIGH (ref 1.7–7.7)
Neutrophils Relative %: 74 %
Platelets: 451 10*3/uL — ABNORMAL HIGH (ref 150–400)
RBC: 3.76 MIL/uL — ABNORMAL LOW (ref 3.87–5.11)
RDW: 17.2 % — ABNORMAL HIGH (ref 11.5–15.5)
WBC: 12.1 10*3/uL — ABNORMAL HIGH (ref 4.0–10.5)
nRBC: 0 % (ref 0.0–0.2)

## 2020-09-12 LAB — TROPONIN I (HIGH SENSITIVITY)
Troponin I (High Sensitivity): 10 ng/L (ref <18)
Troponin I (High Sensitivity): 11 ng/L (ref <18)

## 2020-09-12 LAB — LACTIC ACID, PLASMA: Lactic Acid, Venous: 0.9 mmol/L (ref 0.5–1.9)

## 2020-09-12 MED ORDER — DIPHENHYDRAMINE HCL 50 MG/ML IJ SOLN
INTRAMUSCULAR | Status: AC
  Administered 2020-09-12: 12.5 mg via INTRAVENOUS
  Filled 2020-09-12: qty 1

## 2020-09-12 MED ORDER — IOHEXOL 350 MG/ML SOLN
100.0000 mL | Freq: Once | INTRAVENOUS | Status: AC | PRN
  Administered 2020-09-12: 100 mL via INTRAVENOUS

## 2020-09-12 MED ORDER — DIPHENHYDRAMINE HCL 50 MG/ML IJ SOLN: 12.5000 mg | Freq: Once | INTRAMUSCULAR | Status: AC

## 2020-09-12 MED ORDER — MORPHINE SULFATE (PF) 2 MG/ML IV SOLN
2.0000 mg | Freq: Once | INTRAVENOUS | Status: AC
  Administered 2020-09-12: 2 mg via INTRAVENOUS
  Filled 2020-09-12: qty 1

## 2020-09-12 NOTE — ED Notes (Signed)
Pt now to CT dept via stretcher; awake and alert; no acute changes noted

## 2020-09-12 NOTE — ED Triage Notes (Signed)
Pt arrives from [facility] via [facility] with c/o cp that began pm hours of day prior to arrival.  81mg  ASA and 1.2mg  Nitro given at facilty -- no additional pre-hospital tx with ems prior to arrival.  Pt GCS 14 at baseline-- O2 dependent @3L  via [place]

## 2020-09-12 NOTE — Progress Notes (Signed)
[facility] ED [address] [facility]) Hospital Liaison RN note:  Andrea Barker is under hospice services with [facility]. She is a DNR and her hospice diagnosis is severe protein calorie malnutrition. Her hospice care team has been notified. [facility] Liaison will follow for disposition.  Please call with any hospice related questions or concerns.  Thank you.  Cyndra Numbers, RN [facility] Liaison [phone]

## 2020-09-12 NOTE — ED Provider Notes (Signed)
[facility] Emergency Department Provider Note  ____________________________________________   None    (approximate)  I have reviewed the triage vital signs and the nursing notes.   HISTORY  Chief Complaint Chest Pain   HPI Andrea Barker is a 80 y.o. 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 difficile colitis with findings and imaging concerning for ischemic colitis treated conservatively who presents via EMS from nursing facility after she started complaining of some left-sided chest discomfort earlier this evening.  Patient states she started feeling bad this afternoon.  Patient received 324 mg of ASA and 2 sublingual nitros prior to EMS arrival which she states are not helping much.  She denies any other acute complaints including headache, earache, sore throat, fevers, cough, change in chronic shortness of breath, abdominal pain, vomiting or diarrhea.  No other acute concerns at this time.  I was able to reach staff patient's nursing facility states they were supposed to call hospice but did not accidentally and instead called EMS.  I was able to reach patient's son and daughter-in-law who stated the patient is actually much better over the last couple weeks with hospice at her nursing facility and they were told she may be "graduated" from hospice and would like to keep her DNR and confirm she is not comfort care at this time.         Past Medical History:  Diagnosis Date  . Anxiety   . Arthritis   . GERD (gastroesophageal reflux disease)   . Hypertension   . Seasonal allergic rhinitis     Patient Active Problem List   Diagnosis Date Noted  . Severe sepsis (HCC)   . Clostridium difficile colitis   . Functional quadriplegia (HCC) 08/08/2020  . Altered  mental status 07/31/2020  . Failure to thrive in adult 07/31/2020  . Essential hypertension 07/31/2020  . CKD (chronic kidney disease), stage III (HCC) 07/31/2020  . Debility 07/31/2020    Past Surgical History:  Procedure Laterality Date  . PARTIAL HYSTERECTOMY      Prior to Admission medications   Medication Sig Start Date End Date Taking? Authorizing Provider  amLODipine (NORVASC) 10 MG tablet Take 10 mg by mouth daily.    [provider]  sertraline (ZOLOFT) 50 MG tablet Take 50 mg by mouth at bedtime.    [provider]    Allergies Avelox [moxifloxacin hcl in nacl], Calamine, Clindamycin, Clindamycin/lincomycin, Doxepin, Fluorouracil, Indomethacin, Levaquin [levofloxacin], Minocycline, Penicillins, Sulfa antibiotics, Vancomycin, Latex, and Neomycin-bacitracin zn-polymyx  No family history on file.  Social History Social History   Tobacco Use  . Smoking status: Former Games developer  . Smokeless tobacco: Never Used  Substance Use Topics  . Alcohol use: Yes    Comment: occaional  . Drug use: No    Review of Systems  Review of Systems  Constitutional: Negative for chills and fever.  HENT: Negative for sore throat.   Eyes: Negative for pain.  Respiratory: Negative for cough and stridor.   Cardiovascular: Positive for chest pain.  Gastrointestinal: Negative for vomiting.  Genitourinary: Negative for dysuria.  Musculoskeletal: Negative for myalgias.  Skin: Negative for rash.  Neurological: Negative for seizures, loss of consciousness and headaches.  Psychiatric/Behavioral: Negative for suicidal ideas.  All other systems reviewed and are negative.     ____________________________________________  PHYSICAL EXAM:  VITAL SIGNS: ED Triage Vitals  Enc Vitals Group     BP      Pulse      Resp      Temp      Temp src      SpO2      Weight      Height      Head Circumference      Peak Flow      Pain Score      Pain Loc      Pain Edu?       Excl. in GC?    Vitals:   09/12/20 0300 09/12/20 0500  BP: (!) 146/82 (!) 141/78  Pulse: 91 78  Resp: 19 (!) 21  Temp:    SpO2: 99% 99%   Physical Exam Vitals and nursing note reviewed.  Constitutional:      General: She is not in acute distress.    Appearance: She is well-developed.  HENT:     Head: Normocephalic and atraumatic.     Right Ear: External ear normal.     Left Ear: External ear normal.     Nose: Nose normal.  Eyes:     Conjunctiva/sclera: Conjunctivae normal.  Cardiovascular:     Rate and Rhythm: Normal rate and regular rhythm.     Heart sounds: No murmur heard.   Pulmonary:     Effort: Pulmonary effort is normal. No respiratory distress.     Breath sounds: Normal breath sounds.  Abdominal:     Palpations: Abdomen is soft.     Tenderness: There is no abdominal tenderness.  Musculoskeletal:     Cervical back: Neck supple.  Skin:    General: Skin is warm and dry.     Capillary Refill: Capillary refill takes less than 2 seconds.  Neurological:     Mental Status: She is alert. Mental status is at baseline. She is disoriented and confused.      ____________________________________________   LABS (all labs ordered are listed, but only abnormal results are displayed)  Labs Reviewed  CBC WITH DIFFERENTIAL/PLATELET - Abnormal; Notable for the following components:      Result Value   WBC 12.1 (*)    RBC 3.76 (*)    Hemoglobin 11.2 (*)    HCT 35.1 (*)    RDW 17.2 (*)    Platelets 451 (*)    Neutro Abs 8.9 (*)    All other components within normal limits  COMPREHENSIVE METABOLIC PANEL - Abnormal; Notable for the following components:   CO2 20 (*)    Glucose, Bld 134 (*)    BUN 25 (*)    Calcium 8.4 (*)    Albumin 2.7 (*)    AST 14 (*)    All other components within normal limits  LACTIC ACID, PLASMA  URINALYSIS, COMPLETE (UACMP) WITH MICROSCOPIC  TROPONIN I (HIGH SENSITIVITY)  TROPONIN I (HIGH SENSITIVITY)    ____________________________________________  EKG  Sinus rhythm with a ventricular rate of 99, normal axis, unremarkable intervals, no clear evidence of acute ischemia or significant underlying arrhythmia.  ____________________________________________  RADIOLOGY  ED MD interpretation: No overt edema, pneumothorax, pneumonia or other clear acute intrathoracic process.  CT chest abdomen pelvis shows no evidence of aortic aneurysm.  Stable focal dissection within the infrarenal abdominal aorta.  Extensive atherosclerotic plaque.  CAD.  Left ventricular hypertrophy.  Decreasing pericardial effusion from prior.  Stable dissection flap involving proximal celiac with some aneurysmal dilation.  Stable dissection within  the proximal superior mesenteric artery without evidence of flow limitation.  Exophytic 17 mm hypodense lesion in the right kidney.  Stable cystic lesion in the pancreas.  Official radiology report(s): DG Chest Portable 1 View  Result Date: 09/12/2020 CLINICAL DATA:  Chest pain EXAM: PORTABLE CHEST 1 VIEW COMPARISON:  08/08/2020 FINDINGS: Stable elevation of the right hemidiaphragm. Mild left basilar atelectasis. Lungs are otherwise clear. No pneumothorax. Stable tiny left pleural effusion or pleural thickening. Cardiac size within normal limits. Pulmonary vascularity is normal. Multiple healed right rib fractures are noted. Surgical clips noted within the left axilla. No acute bone abnormality. IMPRESSION: No radiographic evidence of acute cardiopulmonary disease. Stable elevation of the right hemidiaphragm. Electronically Signed   By: Helyn Numbers MD   On: 09/12/2020 02:36   CT Angio Chest/Abd/Pel for Dissection W and/or Wo Contrast  Result Date: 09/12/2020 CLINICAL DATA:  Chest and abdominal pain EXAM: CT ANGIOGRAPHY CHEST, ABDOMEN AND PELVIS TECHNIQUE: Non-contrast CT of the chest was initially obtained. Multidetector CT imaging through the chest, abdomen and pelvis was performed  using the standard protocol during bolus administration of intravenous contrast. Multiplanar reconstructed images and MIPs were obtained and reviewed to evaluate the vascular anatomy. CONTRAST:  OMNIPAQUE IOHEXOL 350 MG/ML SOLN COMPARISON:  CTA abdomen pelvis 08/08/2020 FINDINGS: CTA CHEST FINDINGS Cardiovascular: The thoracic aorta is of normal caliber. No intramural hematoma, dissection, or aneurysm. Moderate atherosclerotic calcification noted within the arch and descending segment. Standard arch vessel anatomy with wide patency of the arch vasculature proximally. Moderate coronary artery calcification. Global cardiac size within normal limits. Mild left ventricular hypertrophy. Small pericardial effusion, significantly decreased in size since prior examination. The central pulmonary arteries are of normal caliber. No intraluminal filling defect identified through the segmental level to suggest acute pulmonary embolism. Mediastinum/Nodes: Visualized thyroid is unremarkable. No pathologic adenopathy within the thorax. Esophagus unremarkable. Lungs/Pleura: Small left pleural effusion with associated left basilar atelectasis. Mild right basilar atelectasis. Moderate centrilobular emphysema. No pneumothorax. No central obstructing lesion. Musculoskeletal: No acute bone abnormality. No lytic or blastic bone lesions identified within the thorax. Review of the MIP images confirms the above findings. CTA ABDOMEN AND PELVIS FINDINGS VASCULAR Aorta: Extensive mixed atherosclerotic plaque is seen within the abdominal aorta. A short segment infrarenal dissection flap is identified, best noted on axial image # 98 and coronal image # 59, stable since prior examination. No aneurysm. 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  Moderate focal stenosis of the proximal celiac axis of 50-75%. Distally, the vessels are patent without evidence of aneurysm. SMA: There is a very short segment dissection flap identified within the proximal celiac axis, best seen on axial image # 85-86. This does not appear flow limiting. Distally, the vessel is widely patent and there is no evidence of aneurysm. Renals: High-grade focal stenosis within the mid segment of the left renal artery is again identified. Less than 50% stenosis of the proximal right renal artery identified with early bifurcation of this vessel. IMA: Less than 50% stenosis at its origin.  Distally widely patent. Inflow: Moderate atherosclerotic calcification noted without evidence of hemodynamically significant stenosis. Internal iliac arteries are patent bilaterally. Veins: Not well opacified, but morphologically unremarkable. Review of the MIP images confirms the above findings. NON-VASCULAR Hepatobiliary: No focal liver abnormality is seen. No gallstones, gallbladder wall thickening, or biliary dilatation.  Pancreas: 12 mm hypodense lesion within the a mid body of the pancreas is unchanged, possibly representing a side branch IP MT, pancreatic cyst, or cystic neoplasm. This is not well assessed on this examination. The pancreas is otherwise unremarkable. Spleen: Unremarkable Adrenals/Urinary Tract: The adrenal glands are unremarkable. The left kidney is markedly atrophic. Mild scattered cortical atrophy involving the right kidney. Scattered cortical scarring is seen involving the right kidney. There is again noted a hyperdense exophytic lesion arising from the posterior interpolar region of the right kidney which is indeterminate. This may represent a hypoenhancing solid renal mass or hyperdense renal cyst. No hydronephrosis. No intrarenal or ureteral calculi. Bladder unremarkable. Stomach/Bowel: The stomach, small bowel, and large bowel are unremarkable. No free intraperitoneal gas or  fluid. Lymphatic: No pathologic adenopathy within the abdomen and pelvis. Reproductive: Status post hysterectomy. No adnexal masses. Other: Tiny fat containing umbilical hernia.  Rectum unremarkable. Musculoskeletal: Advanced degenerative change noted involving the left hip. Moderate to severe degenerative change involving the right hip. Right hip ORIF has been performed. Advanced degenerative changes are seen within the lumbar spine with superimposed moderate levoscoliosis. No lytic or blastic bone lesions are identified. Review of the MIP images confirms the above findings. IMPRESSION: No evidence of a thoracoabdominal aortic aneurysm. Stable limited focal dissection within the infrarenal abdominal aorta. Extensive atherosclerotic plaque throughout the thoracoabdominal aorta. Moderate coronary artery calcification. Mild left ventricular hypertrophy. Decreasing pericardial effusion, now small in size. 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 Stable short segment dissection within the proximal superior mesenteric artery without evidence of flow limitation. Marked left renal atrophy. Scattered right renal cortical scarring. Stable exophytic 17 mm hypodense lesion arising from the interpolar region of the right kidney representing either a hypodense renal mass or hyperdense renal cyst. Dedicated contrast enhanced renal mass protocol CT or MRI examination is recommended for further evaluation once the patient's acute issues have resolved. Stable indeterminate 12 mm cystic lesion within the mid body of the pancreas. Recommend follow up pre and post contrast MRI/MRCP or pancreatic protocol CT in 2 years. This recommendation follows ACR consensus guidelines: Management of Incidental Pancreatic Cysts: A White Paper of the ACR Incidental Findings Committee. J Am Coll Radiol 2017;14:911-923. Aortic Atherosclerosis (ICD10-I70.0).  Electronically Signed   By: Helyn Numbers MD   On: 09/12/2020 06:36    ____________________________________________   PROCEDURES  Procedure(s) performed (including Critical Care):  .1-3 Lead EKG Interpretation Performed by: Gilles Chiquito, MD Authorized by: Gilles Chiquito, MD     Interpretation: normal     ECG rate assessment: normal     Rhythm: sinus rhythm     Ectopy: none     Conduction: normal       ____________________________________________   INITIAL IMPRESSION / ASSESSMENT AND PLAN / ED COURSE      Patient presents with above-stated history exam for assessment of some sided chest pain started this afternoon at her nursing facility.  On arrival she is afebrile hemodynamically stable.  Differential includes ACS, PE, pneumonia, pneumothorax, GI etiologies, dissection, mesenteric ischemia.  Chest x-ray has no findings to suggest pneumonia or pneumothorax.  ECG has some nonspecific findings but no clear evidence of ischemia and given nonelevated troponin x2, low suspicion for ACS or myocarditis.  CBC shows no acute leukocytosis with WBC count 12.1 compared to 52-month ago or acute anemia explain patient symptoms.  CMP shows no significant electrolyte or metabolic derangements.  Patient given some morphine on arrival for chest pain and some itching at the IV site.  She was given a small dose of Benadryl and had no further injury or other acute complaints.  No evidence of anaphylactic or other significant reaction.  CTA C/A/P remarkable for "CT chest abdomen pelvis shows no evidence of aortic aneurysm.  Stable focal dissection within the infrarenal abdominal aorta.  Extensive atherosclerotic plaque.  CAD.  Left ventricular hypertrophy.  Decreasing pericardial effusion from prior.  Stable dissection flap involving proximal celiac with some aneurysmal dilation.  Stable dissection within the proximal superior mesenteric artery without evidence of flow limitation.  Exophytic 17  mm hypodense lesion in the right kidney.  Stable cystic lesion in the pancreas."  Unclear if above findings involving patient's abdominal vasculature are related to her symptoms.  Discussed these with on-call vascular surgeon Dr. Gilda Crease who said he could evaluate the patient as it was with the goals of care but after extensive discussion with patient's family including her son Tawanna Cooler and daughter-in-law Jasmine December any surgery or invasive intervention is not within her goals of care.  I do think this is very reasonable at this will defer formal consult with Dr. Gilda Crease.  Given patient does appear comfortable as she was noted to be sneezing but easily arousable on multiple reassessments of believe is reasonable for her to be discharged back to hospice care at nursing facility.       ____________________________________________   FINAL CLINICAL IMPRESSION(S) / ED DIAGNOSES  Final diagnoses:  Aortic atherosclerosis (HCC)  Chest pain, unspecified type  Epigastric pain  Pancreatic cyst  Chronic thoracic aortic dissection (HCC)  Celiac artery dissection (HCC)  Goals of care, counseling/discussion    Medications  morphine 2 MG/ML injection 2 mg (2 mg Intravenous Given 09/12/20 0228)  diphenhydrAMINE (BENADRYL) injection 12.5 mg (12.5 mg Intravenous Given 09/12/20 0322)  iohexol (OMNIPAQUE) 350 MG/ML injection 100 mL (100 mLs Intravenous Contrast Given 09/12/20 0546)     ED Discharge Orders    None       Note:  This document was prepared using Dragon voice recognition software and may include unintentional dictation errors.   Gilles Chiquito, MD 09/12/20 [phone]

## 2020-09-12 NOTE — ED Notes (Signed)
After 2mg  IVP Morphine given pt began reporting itching to area proximal to IV site- no redness, hives or edema noted.  Notified Dr and plan for Benadryl to be ordered

## 2020-09-12 NOTE — ED Notes (Signed)
Pt did not sign for discharge. Pt has dementia.

## 2020-09-12 NOTE — Discharge Instructions (Signed)
Your CT today showed: No evidence of a thoracoabdominal aortic aneurysm. Stable limited focal dissection within the infrarenal abdominal aorta. Extensive atherosclerotic plaque throughout the thoracoabdominal aorta.   Moderate coronary artery calcification. Mild left ventricular hypertrophy. Decreasing pericardial effusion, now small in size.   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   Stable short segment dissection within the proximal superior mesenteric artery without evidence of flow limitation.   Marked left renal atrophy. Scattered right renal cortical scarring. Stable exophytic 17 mm hypodense lesion arising from the interpolar region of the right kidney representing either a hypodense renal mass or hyperdense renal cyst. Dedicated contrast enhanced renal mass protocol CT or MRI examination is recommended for further evaluation once the patient's acute issues have resolved.   Stable indeterminate 12 mm cystic lesion within the mid body of the pancreas. Recommend follow up pre and post contrast MRI/MRCP or pancreatic protocol CT in 2 years. This recommendation follows ACR consensus guidelines: Management of Incidental Pancreatic Cysts: A White Paper of the ACR Incidental Findings Committee. J Am Coll Radiol 2017;14:911-923.   Aortic Atherosclerosis (ICD10-I70.0).

## 2020-09-12 NOTE — ED Notes (Addendum)
Entered room to find pt resting with eyes closed; RR even and unlabored on 3L O2 via [place] - easily aroused with verbal stimulation.  No distress noted and pt does not verbalize any complaints at this time.  Will continue to monitor for acute changes and maintain plan of care as awaits CTA ches/abd/pelvis - UA also to be collected with next void

## 2022-05-11 IMAGING — MR MR HEAD W/O CM
10 series · 48 of 48 positions shown · non-contrast
Comparison: Prior CT from earlier the same day.

CLINICAL DATA: Initial evaluation for normal pressure
hydrocephalus.

EXAM:
MRI HEAD WITHOUT CONTRAST
TECHNIQUE: Multiplanar, multiecho pulse sequences of the brain and surrounding
structures were obtained without intravenous contrast.

[Series 2: ax dwi_tracew · axial · 3.0mm · 1.31mm/px · z∈[-89,+64]mm · 8 of 48 slices shown]
[im 1/48]
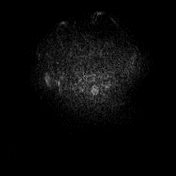
[im 7/48]
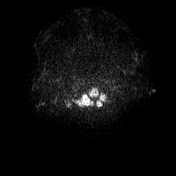
[im 14/48]
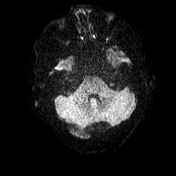
[im 21/48]
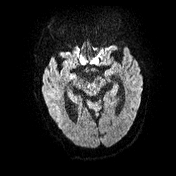
[im 27/48]
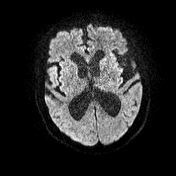
[im 34/48]
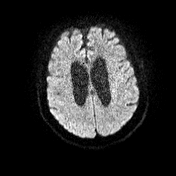
[im 41/48]
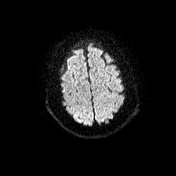
[im 48/48]
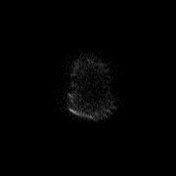

[Series 3: ax dwi_adc · axial · 3.0mm · 1.31mm/px · z∈[-89,+64]mm · 7 of 48 slices shown]
[im 1/48]
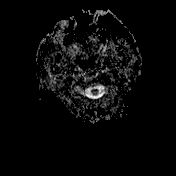
[im 8/48]
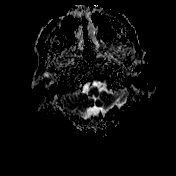
[im 16/48]
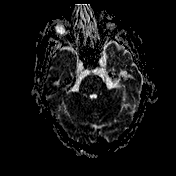
[im 24/48]
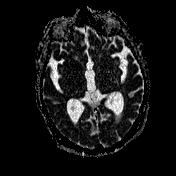
[im 32/48]
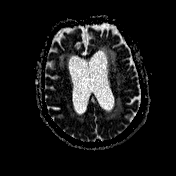
[im 40/48]
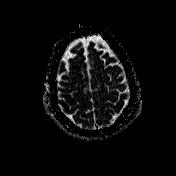
[im 48/48]
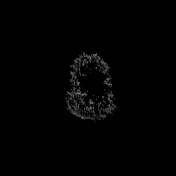

[Series 4: cor dwi_tracew · coronal · 5.0mm · 1.31mm/px · 5 of 38 slices shown]
[im 1/38]
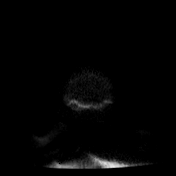
[im 10/38]
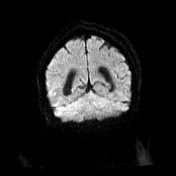
[im 19/38]
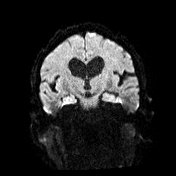
[im 28/38]
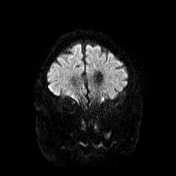
[im 38/38]
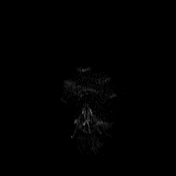

[Series 5: cor dwi_adc · coronal · 5.0mm · 1.31mm/px · 5 of 38 slices shown]
[im 1/38]
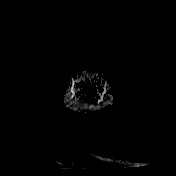
[im 10/38]
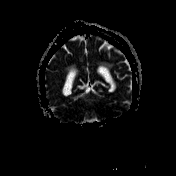
[im 19/38]
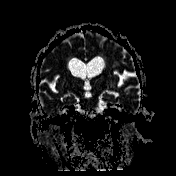
[im 28/38]
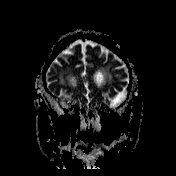
[im 38/38]
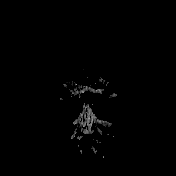

[Series 6: T1 · sagittal · 5.0mm · 0.94mm/px · 3 of 21 slices shown (1 of 2)]
[im 1/21]
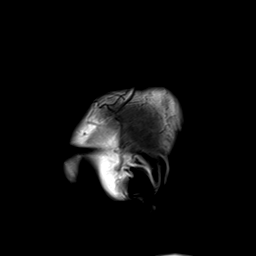
[im 11/21]
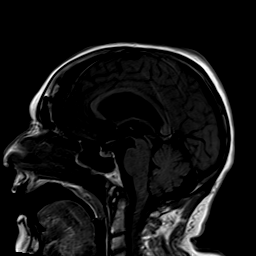
[im 21/21]
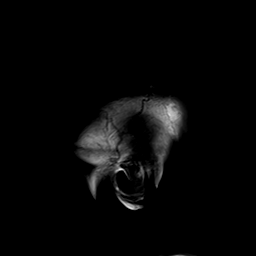

[Series 7: T2 · axial · 5.0mm · 0.45mm/px · z∈[-89,+65]mm · 4 of 27 slices shown (1 of 2)]
[im 1/27]
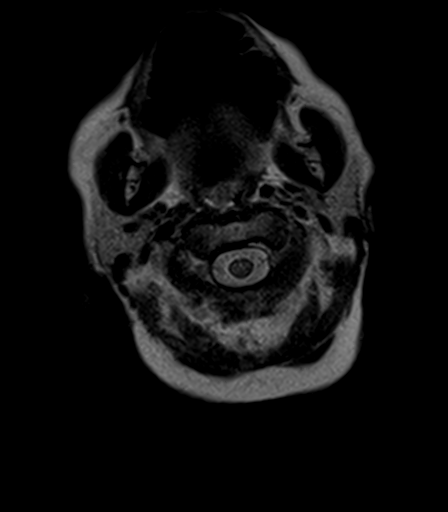
[im 9/27]
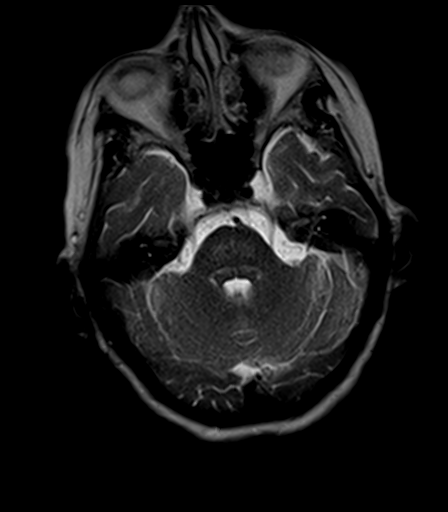
[im 18/27]
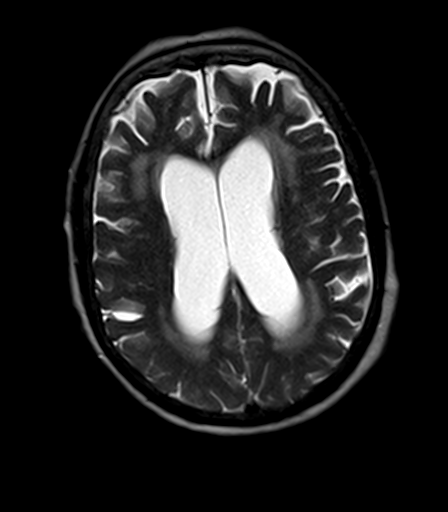
[im 27/27]
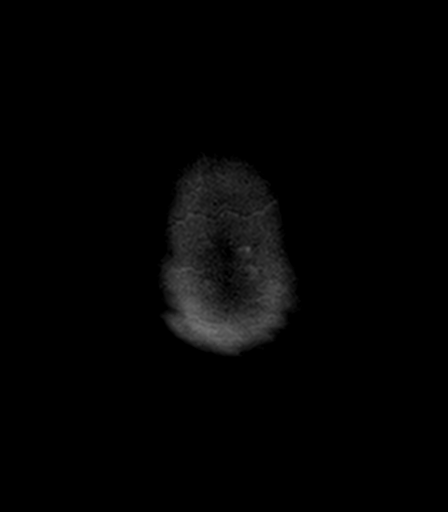

[Series 8: T2-star · axial · 5.0mm · 0.45mm/px · z∈[-89,+65]mm · 4 of 27 slices shown]
[im 1/27]
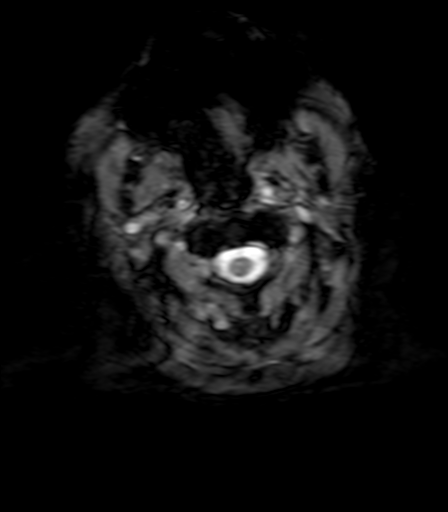
[im 9/27]
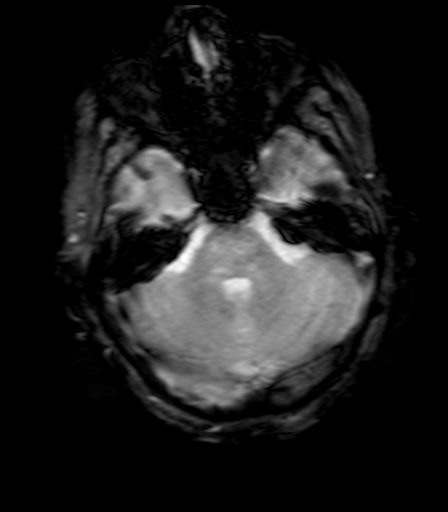
[im 18/27]
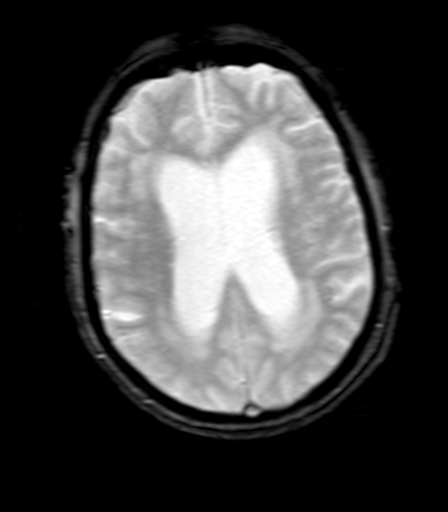
[im 27/27]
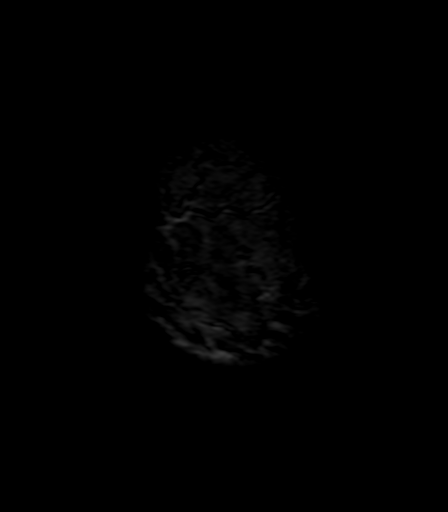

[Series 9: T1 · axial · 5.0mm · 0.90mm/px · z∈[-89,+65]mm · 4 of 27 slices shown (2 of 2)]
[im 1/27]
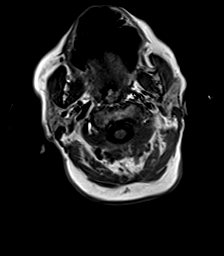
[im 9/27]
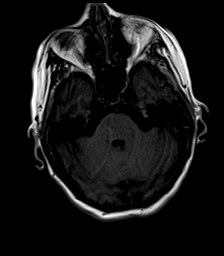
[im 18/27]
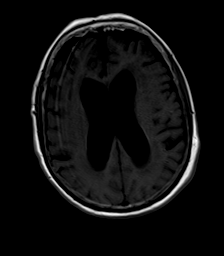
[im 27/27]
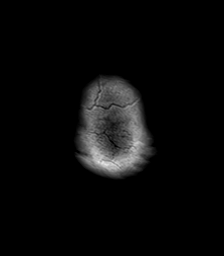

[Series 10: FLAIR · axial · 5.0mm · 1.20mm/px · z∈[-89,+65]mm · 4 of 27 slices shown]
[im 1/27]
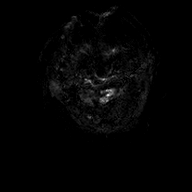
[im 9/27]
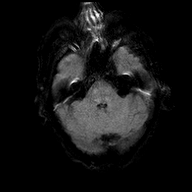
[im 18/27]
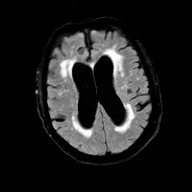
[im 27/27]
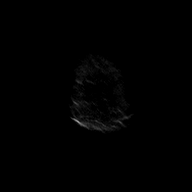

[Series 11: T2 · coronal · 5.0mm · 0.45mm/px · 4 of 31 slices shown (2 of 2)]
[im 1/31]
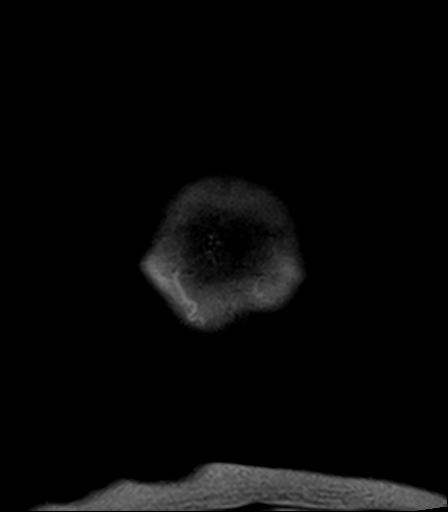
[im 11/31]
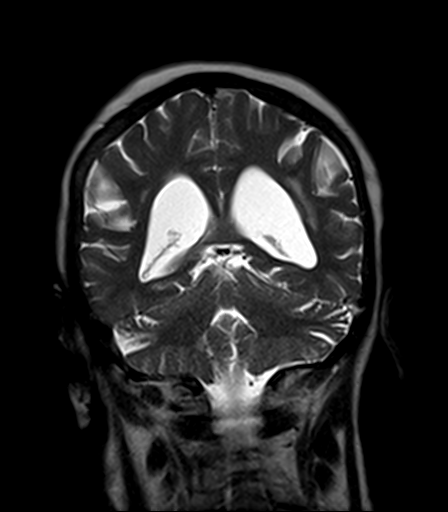
[im 21/31]
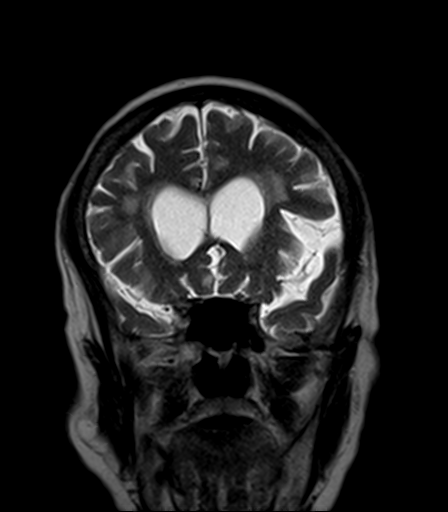
[im 31/31]
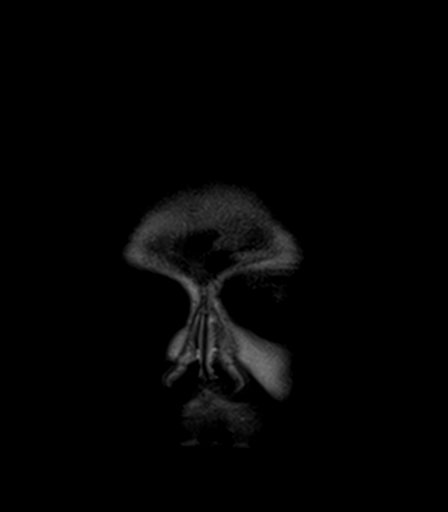

[48 of 48 positions shown; findings below may reference images not displayed]

FINDINGS: Brain: Diffuse prominence of the CSF containing spaces compatible
with generalized cerebral atrophy. Patchy and confluent T2/FLAIR
hyperintensity within the periventricular and deep white matter both
cerebral hemispheres most likely related chronic microvascular
ischemic disease, moderate in nature. Patchy involvement of the pons
noted.

No convincing foci of diffusion abnormality to suggest acute or
subacute ischemia are seen. Gray-white matter differentiation
maintained. No encephalomalacia to suggest chronic cortical
infarction. No foci of susceptibility artifact to suggest acute or
chronic intracranial hemorrhage.

Diffuse prominence of the lateral and third ventricles, with
relatively normal caliber of the fourth ventricle is seen. This is
somewhat out of proportion to overlying cortical sulcation. Zully
index measures 0.4. Findings suggestive of NPH. A portion of the
periventricular T2/FLAIR signal abnormality about the lateral
ventricles may in part be related to a degree of mild transependymal
flow of CSF.

No mass lesion, mass effect, or midline shift. No extra-axial fluid
collection. Pituitary gland suprasellar region normal. Midline
structures intact.

Vascular: Major intracranial vascular flow voids are maintained.
Dominant right vertebral artery noted.

Skull and upper cervical spine: Craniocervical junction normal. Bone
marrow signal intensity within normal limits. No scalp soft tissue
abnormality.

Sinuses/Orbits: Patient status post bilateral ocular lens
replacement. Globes and orbital soft tissues demonstrate no acute
finding. Paranasal sinuses are largely clear. No mastoid effusion.
Inner ear structures grossly normal.

Other: None.
IMPRESSION: 1. Diffuse prominence of the lateral and third ventricles, somewhat
out of proportion to cortical sulcation, with relatively normal
caliber of the fourth ventricle. Findings suggestive of possible
NPH. Clinical correlation recommended.
2. Underlying age-related cerebral atrophy with moderate chronic
microvascular ischemic disease.
3. No other acute intracranial abnormality.
